# Patient Record
Sex: Male | Born: 1990 | Hispanic: Yes | Marital: Single | Smoking: Current every day smoker
Health system: Southern US, Community
[De-identification: ages and names within clinical notes are randomized; demographics above are authoritative.]

---

## 2016-04-11 ENCOUNTER — Emergency Department (HOSPITAL_COMMUNITY): Payer: Self-pay

## 2016-04-11 ENCOUNTER — Encounter (HOSPITAL_COMMUNITY): Payer: Self-pay | Admitting: Emergency Medicine

## 2016-04-11 ENCOUNTER — Emergency Department (HOSPITAL_COMMUNITY)
Admission: EM | Admit: 2016-04-11 | Discharge: 2016-04-12 | Disposition: A | Discharge status: Home / Self Care | Payer: Self-pay | Attending: Emergency Medicine | Admitting: Emergency Medicine

## 2016-04-11 DIAGNOSIS — F17200 Nicotine dependence, unspecified, uncomplicated: Secondary | ICD-10-CM | POA: Insufficient documentation

## 2016-04-11 DIAGNOSIS — K852 Alcohol induced acute pancreatitis without necrosis or infection: Secondary | ICD-10-CM | POA: Insufficient documentation

## 2016-04-11 LAB — COMPREHENSIVE METABOLIC PANEL
ALBUMIN: 4.9 g/dL (ref 3.5–5.0)
ALT: 96 U/L — AB (ref 17–63)
AST: 96 U/L — AB (ref 15–41)
Alkaline Phosphatase: 54 U/L (ref 38–126)
Anion gap: 15 (ref 5–15)
BUN: 5 mg/dL — AB (ref 6–20)
CHLORIDE: 97 mmol/L — AB (ref 101–111)
CO2: 23 mmol/L (ref 22–32)
CREATININE: 0.9 mg/dL (ref 0.61–1.24)
Calcium: 9.8 mg/dL (ref 8.9–10.3)
GFR calc Af Amer: >60 mL/min (ref >60)
GFR calc non Af Amer: >60 mL/min (ref >60)
Glucose, Bld: 135 mg/dL — ABNORMAL HIGH (ref 65–99)
POTASSIUM: 4.8 mmol/L (ref 3.5–5.1)
SODIUM: 135 mmol/L (ref 135–145)
Total Bilirubin: 1.3 mg/dL — ABNORMAL HIGH (ref 0.3–1.2)
Total Protein: 8.3 g/dL — ABNORMAL HIGH (ref 6.5–8.1)

## 2016-04-11 LAB — CBC WITH DIFFERENTIAL/PLATELET
BASOS ABS: 0 10*3/uL (ref 0.0–0.1)
BASOS PCT: 0 %
EOS ABS: 0 10*3/uL (ref 0.0–0.7)
EOS PCT: 0 %
HCT: 50.9 % (ref 39.0–52.0)
Hemoglobin: 17.8 g/dL — ABNORMAL HIGH (ref 13.0–17.0)
LYMPHS PCT: 5 %
Lymphs Abs: 0.8 10*3/uL (ref 0.7–4.0)
MCH: 31.2 pg (ref 26.0–34.0)
MCHC: 35 g/dL (ref 30.0–36.0)
MCV: 89.3 fL (ref 78.0–100.0)
Monocytes Absolute: 0.7 10*3/uL (ref 0.1–1.0)
Monocytes Relative: 5 %
Neutro Abs: 13.3 10*3/uL — ABNORMAL HIGH (ref 1.7–7.7)
Neutrophils Relative %: 90 %
PLATELETS: 245 10*3/uL (ref 150–400)
RBC: 5.7 MIL/uL (ref 4.22–5.81)
RDW: 12.6 % (ref 11.5–15.5)
WBC: 14.9 10*3/uL — AB (ref 4.0–10.5)

## 2016-04-11 LAB — URINALYSIS, ROUTINE W REFLEX MICROSCOPIC
Bilirubin Urine: NEGATIVE
GLUCOSE, UA: NEGATIVE mg/dL
Ketones, ur: NEGATIVE mg/dL
LEUKOCYTES UA: NEGATIVE
Nitrite: NEGATIVE
PROTEIN: 100 mg/dL — AB
SPECIFIC GRAVITY, URINE: 1.028 (ref 1.005–1.030)
pH: 6 (ref 5.0–8.0)

## 2016-04-11 LAB — LIPASE, BLOOD: Lipase: 897 U/L — ABNORMAL HIGH (ref 11–51)

## 2016-04-11 LAB — URINE MICROSCOPIC-ADD ON: WBC, UA: NONE SEEN WBC/hpf (ref 0–5)

## 2016-04-11 MED ORDER — HYDROMORPHONE HCL 1 MG/ML IJ SOLN
1.0000 mg | Freq: Once | INTRAMUSCULAR | Status: AC
  Administered 2016-04-11: 1 mg via INTRAVENOUS
  Filled 2016-04-11: qty 1

## 2016-04-11 MED ORDER — GI COCKTAIL ~~LOC~~
30.0000 mL | Freq: Once | ORAL | Status: AC
  Administered 2016-04-11: 30 mL via ORAL
  Filled 2016-04-11: qty 30

## 2016-04-11 MED ORDER — IOPAMIDOL (ISOVUE-300) INJECTION 61%
INTRAVENOUS | Status: AC
  Administered 2016-04-11: 100 mL
  Filled 2016-04-11: qty 100

## 2016-04-11 MED ORDER — ONDANSETRON HCL 4 MG/2ML IJ SOLN
4.0000 mg | Freq: Once | INTRAMUSCULAR | Status: AC
  Administered 2016-04-11: 4 mg via INTRAVENOUS
  Filled 2016-04-11: qty 2

## 2016-04-11 MED ORDER — SODIUM CHLORIDE 0.9 % IV BOLUS (SEPSIS)
1000.0000 mL | Freq: Once | INTRAVENOUS | Status: AC
  Administered 2016-04-11: 1000 mL via INTRAVENOUS

## 2016-04-11 MED ORDER — MORPHINE SULFATE (PF) 4 MG/ML IV SOLN
4.0000 mg | Freq: Once | INTRAVENOUS | Status: AC
  Administered 2016-04-11: 4 mg via INTRAVENOUS
  Filled 2016-04-11: qty 1

## 2016-04-11 NOTE — ED Notes (Signed)
Pt to CT

## 2016-04-11 NOTE — ED Notes (Signed)
Pt to xray

## 2016-04-11 NOTE — ED Triage Notes (Signed)
Pt c/o mid epigastric pain onset approx 3am today.  Also c/o nausea with vomiting.  Pt st's pain increases with deep breathing.  Admits  To a lot of ETOH yesterday

## 2016-04-12 MED ORDER — ONDANSETRON 4 MG PO TBDP: 4.0000 mg | ORAL_TABLET | Freq: Three times a day (TID) | ORAL | 0 refills | Status: AC | PRN

## 2016-04-12 MED ORDER — OXYCODONE HCL 5 MG PO TABS: 5.0000 mg | ORAL_TABLET | ORAL | 0 refills | Status: AC | PRN

## 2016-04-12 MED ORDER — CHLORDIAZEPOXIDE HCL 25 MG PO CAPS: ORAL_CAPSULE | ORAL | 0 refills | Status: AC

## 2016-04-12 NOTE — ED Provider Notes (Signed)
Recieved patient in turnover. 25 year old male with alcohol-induced pancreatitis. His pain and nausea were aggressively treated on my exam patient is feeling much better and requesting discharge home. Prescribed him pain medicine and nausea medicine and Librium. PCP follow-up.   Melene Planan Eleftheria Taborn, DO 04/12/16 [phone]

## 2016-04-12 NOTE — Discharge Instructions (Signed)
Clear liquid diet for the next three days or so, then slowly escalate foods.

## 2016-04-12 NOTE — ED Provider Notes (Signed)
[facility]-EMER[facility]ENCY DEPT Provider Note   CSN: [id number] Arrival date & time: 04/11/16  1700     History   Chief Complaint Chief Complaint  Patient presents with  . Abdominal Pain    HPI Jason Norris is a 25 y.o. male.  HPI 25 year old male with extensive history of drinking who presents with epigastric pain. Patient admits to increased alcohol intake over the last several days. At approximately 3 AM today, he expressed acute onset of severe, aching, gnawing, epigastric abdominal pain. He had associated multiple episodes of nonbloody, nonbilious emesis. He denies history of similar symptoms. Denies any preceding trauma. Since then, his pain is progressively worsening. He denies any alleviating factors. His pain is made worse with eating as well as palpation. No fevers. Denies history of gallstones or known pancreatitis.  History reviewed. No pertinent past medical history.  There are no active problems to display for this patient.   History reviewed. No pertinent surgical history.     Home Medications    Prior to Admission medications   Medication Sig Start Date End Date Taking? Authorizing Provider  chlordiazePOXIDE (LIBRIUM) 25 MG capsule 50mg  PO TID x 1D, then 25-50mg  PO BID X 1D, then 25-50mg  PO QD X 1D 04/12/16   Melene Planan Floyd, DO  ondansetron (ZOFRAN ODT) 4 MG disintegrating tablet Take 1 tablet (4 mg total) by mouth every 8 (eight) hours as needed for nausea or vomiting. 04/12/16   Melene Planan Floyd, DO  oxyCODONE (ROXICODONE) 5 MG immediate release tablet Take 1 tablet (5 mg total) by mouth every 4 (four) hours as needed for severe pain. 04/12/16   Melene Planan Floyd, DO    Family History No family history on file.  Social History Social History  Substance Use Topics  . Smoking status: Current Every Day Smoker  . Smokeless tobacco: Never Used  . Alcohol use Yes     Allergies   Review of patient's allergies indicates no known allergies.   Review of Systems Review of  Systems  Constitutional: Negative for chills, fatigue and fever.  HENT: Negative for congestion and rhinorrhea.   Eyes: Negative for visual disturbance.  Respiratory: Negative for cough, shortness of breath and wheezing.   Cardiovascular: Negative for chest pain and leg swelling.  Gastrointestinal: Positive for abdominal pain, nausea and vomiting. Negative for diarrhea.  Genitourinary: Negative for dysuria and flank pain.  Musculoskeletal: Negative for neck pain and neck stiffness.  Skin: Negative for rash and wound.  Allergic/Immunologic: Negative for immunocompromised state.  Neurological: Negative for syncope, weakness and headaches.  All other systems reviewed and are negative.    Physical Exam Updated Vital Signs BP 157/84   Pulse 78   Temp 97.6 F (36.4 C) (Oral)   Resp 20   Ht 6' (1.829 m)   Wt 178 lb (80.7 kg)   SpO2 100%   BMI 24.14 kg/m   Physical Exam  Constitutional: He is oriented to person, place, and time. He appears well-developed and well-nourished. No distress.  HENT:  Head: Normocephalic and atraumatic.  Eyes: Conjunctivae are normal.  Neck: Neck supple.  Cardiovascular: Normal rate, regular rhythm and normal heart sounds.  Exam reveals no friction rub.   No murmur heard. Pulmonary/Chest: Effort normal and breath sounds normal. No respiratory distress. He has no wheezes. He has no rales.  Abdominal: Soft. Normal appearance. He exhibits no distension. There is generalized tenderness and tenderness in the epigastric area. There is guarding. There is no rigidity, no rebound, no tenderness at McBurney's point  and negative Murphy's sign.  Musculoskeletal: He exhibits no edema.  Neurological: He is alert and oriented to person, place, and time. He exhibits normal muscle tone.  Skin: Skin is warm. Capillary refill takes less than 2 seconds.  Psychiatric: He has a normal mood and affect.  Nursing note and vitals reviewed.    ED Treatments / Results   Labs (all labs ordered are listed, but only abnormal results are displayed) Labs Reviewed  CBC WITH DIFFERENTIAL/PLATELET - Abnormal; Notable for the following:       Result Value   WBC 14.9 (*)    Hemoglobin 17.8 (*)    Neutro Abs 13.3 (*)    All other components within normal limits  COMPREHENSIVE METABOLIC PANEL - Abnormal; Notable for the following:    Chloride 97 (*)    Glucose, Bld 135 (*)    BUN 5 (*)    Total Protein 8.3 (*)    AST 96 (*)    ALT 96 (*)    Total Bilirubin 1.3 (*)    All other components within normal limits  LIPASE, BLOOD - Abnormal; Notable for the following:    Lipase 897 (*)    All other components within normal limits  URINALYSIS, ROUTINE W REFLEX MICROSCOPIC (NOT AT [facility]) - Abnormal; Notable for the following:    APPearance CLOUDY (*)    Hgb urine dipstick SMALL (*)    Protein, ur 100 (*)    All other components within normal limits  URINE MICROSCOPIC-ADD ON - Abnormal; Notable for the following:    Squamous Epithelial / LPF 0-5 (*)    Bacteria, UA FEW (*)    All other components within normal limits    EKG  EKG Interpretation None       Radiology Dg Chest 2 View  Result Date: 04/11/2016 CLINICAL DATA:  Chest pain, leukocytosis.  Epigastric pain. EXAM: CHEST  2 VIEW COMPARISON:  None. FINDINGS: Cardiomediastinal silhouette is normal. No pleural effusions or focal consolidations. Trachea projects midline and there is no pneumothorax. Soft tissue planes and included osseous structures are non-suspicious. IMPRESSION: Normal chest radiographs. Electronically Signed   By: Awilda Metro M.D.   On: 04/11/2016 22:55   Ct Abdomen Pelvis W Contrast  Result Date: 04/12/2016 CLINICAL DATA:  UA shin for acute epigastric pain with vomiting for 1 day. EXAM: CT ABDOMEN AND PELVIS WITH CONTRAST TECHNIQUE: Multidetector CT imaging of the abdomen and pelvis was performed using the standard protocol following bolus administration of intravenous contrast.  CONTRAST:  1 ISOVUE-300 IOPAMIDOL (ISOVUE-300) INJECTION 61% COMPARISON:  None available. FINDINGS: Lower chest: Mild hazy subsegmental atelectasis seen dependently within the visualized lung bases. Visualized lungs are otherwise clear. Hepatobiliary: Diffuse hypoattenuation of the liver consistent with steatosis. Liver otherwise unremarkable. Gallbladder normal. No biliary dilatation. Pancreas: Pancreas somewhat enlarged and edematous in appearance. There is extensive inflammatory fat stranding within the peripancreatic fat, consistent with acute pancreatitis. No evidence for pancreatic necrosis. No loculated peripancreatic collections. Soft tissue stranding with small amount of free fluid extends inferiorly down the pericolic gutters bilaterally. Inflammatory changes surround the second- third portion the duodenum as well. Spleen: Spleen within normal limits. Adrenals/Urinary Tract: Adrenal glands are normal. Kidneys equal in size with symmetric enhancement. No nephrolithiasis, hydronephrosis, or focal enhancing renal mass. Ureters of normal caliber without acute abnormality. Bladder within normal limits. Stomach/Bowel: Stomach within normal limits. Again, inflammatory changes noted about the duodenal sweep related to the inflamed pancreas. No evidence for bowel obstruction. Appendix within normal limits.  No other acute inflammatory changes about the bowels. Vascular/Lymphatic: Normal intravascular enhancement seen throughout the intra-abdominal aorta and its branch vessels. Portal vein and SMV remain patent as does the splenic vein. No adenopathy. Reproductive: Prostate is normal. Other: No free air. Scatter free fluid within the lower abdomen and pelvis, presumably related to the inflamed pancreas. Musculoskeletal: No acute osseous abnormality. No worrisome lytic or blastic osseous lesions. IMPRESSION: Findings consistent with acute pancreatitis. No complication identified. Electronically Signed   By: Rise Mu M.D.   On: 04/12/2016 00:52    Procedures Procedures (including critical care time)  Medications Ordered in ED Medications  sodium chloride 0.9 % bolus 1,000 mL (0 mLs Intravenous Stopped 04/11/16 2330)  morphine 4 MG/ML injection 4 mg (4 mg Intravenous Given 04/11/16 2229)  ondansetron (ZOFRAN) injection 4 mg (4 mg Intravenous Given 04/11/16 2229)  gi cocktail (Maalox,Lidocaine,Donnatal) (30 mLs Oral Given 04/11/16 2229)  HYDROmorphone (DILAUDID) injection 1 mg (1 mg Intravenous Given 04/11/16 2329)  iopamidol (ISOVUE-300) 61 % injection (100 mLs  Contrast Given 04/11/16 2350)     Initial Impression / Assessment and Plan / ED Course  I have reviewed the triage vital signs and the nursing notes.  Pertinent labs & imaging results that were available during my care of the patient were reviewed by me and considered in my medical decision making (see chart for details).  Clinical Course    25 year old Hispanic male who presents with epigastric abdominal pain after drinking. No chest pain. On arrival, vital signs are stable and within normal limits. Examination shows marked epigastric tenderness. Labwork reviewed and shows moderate leukocytosis, mild transaminitis, and marked elevation of lipase. This is consistent with likely acute, alcoholic pancreatitis. No right upper quadrant tenderness, Murphy's sign or evidence to suggest cholecystitis and patient has no significant elevation in alkaline phosphatase. Will obtain CT scan given first time pancreatitis, start IV fluids, and reassess. No fever or signs of septic or toxic pancreatitis. CBC shows stable hemoglobin do not suspect hemorrhagic pancreatitis. Patient is otherwise well-appearing.  CT scan is pending. Symptoms are improving. Suspect patient may be able to travel outpatient management if CT scan shows no complication.  Final Clinical Impressions(s) / ED Diagnoses   Final diagnoses:  Alcohol-induced acute pancreatitis  without infection or necrosis     Shaune Pollack, MD 04/12/16 [phone]

## 2017-09-06 ENCOUNTER — Ambulatory Visit: Payer: Self-pay | Admitting: Emergency Medicine

## 2017-09-06 ENCOUNTER — Other Ambulatory Visit: Payer: Self-pay

## 2017-09-06 ENCOUNTER — Encounter: Payer: Self-pay | Admitting: Emergency Medicine

## 2017-09-06 VITALS — BP 115/67 | HR 73 | Temp 98.8°F | Resp 16 | Ht 70.0 in | Wt 176.8 lb

## 2017-09-06 DIAGNOSIS — S81812S Laceration without foreign body, left lower leg, sequela: Secondary | ICD-10-CM

## 2017-09-06 DIAGNOSIS — S81819A Laceration without foreign body, unspecified lower leg, initial encounter: Secondary | ICD-10-CM | POA: Insufficient documentation

## 2017-09-06 DIAGNOSIS — Z4802 Encounter for removal of sutures: Secondary | ICD-10-CM

## 2017-09-06 NOTE — Progress Notes (Signed)
Joselyn ArrowFernando Bermudez [place] 27 y.o.   Chief Complaint  Patient presents with  . worker's comp    check wound on LEFT leg on 08/29/2017    HISTORY OF PRESENT ILLNESS: This is a 27 y.o. male workers comp case.  Had sutures placed on his left lower leg 08/29/2017.  Here for wound check and possible suture removal.  Doing well has no complaints.  HPI   Prior to Admission medications   Medication Sig Start Date End Date Taking? Authorizing Provider  chlordiazePOXIDE (LIBRIUM) 25 MG capsule 50mg  PO TID x 1D, then 25-50mg  PO BID X 1D, then 25-50mg  PO QD X 1D Patient not taking: Reported on 09/06/2017 04/12/16   Melene PlanFloyd, Dan, DO  ondansetron (ZOFRAN ODT) 4 MG disintegrating tablet Take 1 tablet (4 mg total) by mouth every 8 (eight) hours as needed for nausea or vomiting. Patient not taking: Reported on 09/06/2017 04/12/16   Melene PlanFloyd, Dan, DO  oxyCODONE (ROXICODONE) 5 MG immediate release tablet Take 1 tablet (5 mg total) by mouth every 4 (four) hours as needed for severe pain. Patient not taking: Reported on 09/06/2017 04/12/16   Melene PlanFloyd, Dan, DO    No Known Allergies  There are no active problems to display for this patient.   No past medical history on file.  No past surgical history on file.  Social History   Socioeconomic History  . Marital status: Single    Spouse name: Not on file  . Number of children: Not on file  . Years of education: Not on file  . Highest education level: Not on file  Social Needs  . Financial resource strain: Not on file  . Food insecurity - worry: Not on file  . Food insecurity - inability: Not on file  . Transportation needs - medical: Not on file  . Transportation needs - non-medical: Not on file  Occupational History  . Not on file  Tobacco Use  . Smoking status: Current Every Day Smoker  . Smokeless tobacco: Never Used  Substance and Sexual Activity  . Alcohol use: Yes  . Drug use: Yes    Types: Marijuana, Cocaine  . Sexual activity: Not on file    Other Topics Concern  . Not on file  Social History Narrative  . Not on file    No family history on file.   Review of Systems  Constitutional: Negative.  Negative for chills and fever.  Respiratory: Negative for shortness of breath.   Cardiovascular: Negative for chest pain.  Gastrointestinal: Negative for nausea and vomiting.  Skin: Negative for rash.  Neurological: Negative for dizziness and headaches.  All other systems reviewed and are negative.    Physical Exam  Constitutional: He appears well-developed and well-nourished.  HENT:  Head: Normocephalic and atraumatic.  Eyes: EOM are normal. Pupils are equal, round, and reactive to light.  Neck: Normal range of motion. Neck supple.  Cardiovascular: Normal rate.  Pulmonary/Chest: Effort normal.  Musculoskeletal: Normal range of motion.  Neurological: He is alert.  Skin: Capillary refill takes less than 2 seconds.  Left lower leg: Well-healing laceration.  No infection.  Some sutures are missing.  Psychiatric: He has a normal mood and affect. His behavior is normal.  Vitals reviewed.   Partial suture removal.  ASSESSMENT & PLAN: Madaline GuthrieFernando was seen today for worker's comp.  Diagnoses and all orders for this visit:  Laceration of calf, left, sequela  Visit for suture removal    Patient Instructions  IF you received an x-ray today, you will receive an invoice from [facility]. Please contact [facility] at [phone] with questions or concerns regarding your invoice.   IF you received labwork today, you will receive an invoice from [facility]. Please contact [facility] at [phone] with questions or concerns regarding your invoice.   Our billing staff will not be able to assist you with questions regarding bills from these companies.  You will be contacted with the lab results as soon as they are available. The fastest way to get your results is to activate your My Chart account.  Instructions are located on the last page of this paperwork. If you have not heard from [place] regarding the results in 2 weeks, please contact this office.     Cuidado de las [employer] adultos (Wound Care, Adult) El cuidado correcto de las heridas puede ayudar a Psychologist, sport and exercise y a Radio producer las infecciones. Adems puede ayudar a que la cicatrizacin sea ms rpida. CMO CUIDAR LA HERIDA Cuidado de las [place] las indicaciones del mdico acerca del cuidado de la herida. Haga lo siguiente: ? Lvese las manos con agua y jabn antes de cambiar la venda (vendaje). Use desinfectante para manos si no dispone de [place] y [place]. ? Cambie el vendaje como se lo haya indicado el mdico. ? No retire los puntos (suturas), el [employer] para la piel o las tiras [place]. Es posible que estos deban quedar puestos en la piel durante 2semanas o ms tiempo. Si los bordes de las tiras [address] empiezan a despegarse y Scientific laboratory technician, puede recortar los que estn sueltos. No retire las tiras [employer] por completo a menos que el mdico se lo indique.  Controle la zona de la herida todos los [address]  [phone] para detectar signos de infeccin. Est atento a los siguientes signos: ? Aumento del enrojecimiento, de la hinchazn o del dolor. ? Ms lquido [place]. ? Calor. ? Pus o mal olor.  Pregntele al mdico si debe limpiar la herida con agua y [place]. Hacer esto puede incluir lo siguiente: ? Usar una toalla limpia para secar la herida dando palmaditas despus de limpiarla. No se frote ni restregue la herida. ? Aplicar una crema o un ungento. Hgalo nicamente como se lo haya indicado el mdico. ? Cubrir la incisin con un vendaje limpio.  Pregntele al mdico cundo puede dejar la herida al descubierto. Medicamentos  Si le recetaron un antibitico, una crema o un ungento con antibitico, tmelo o aplqueselo como se lo haya indicado el mdico. No deje de tomar o de usar el antibitico aunque la afeccin  mejore.  Tome los medicamentos de venta libre y los recetados solamente como se lo haya indicado el mdico. Si le recetaron un analgsico, tmelo al menos antes de Education officer, environmental el cuidado de la herida, Public librarian se lo haya indicado el mdico. Instrucciones generales  Retome sus actividades normales como se lo haya indicado el mdico. Pregntele al mdico qu actividades son seguras.  No se rasque ni se toque la herida.  Concurra a todas las visitas de control como se lo haya indicado el mdico. Esto es importante.  Mantenga una dieta que incluya protenas, vitaminaA, vitaminaC y otros alimentos ricos en nutrientes. Estos alimentos ayudan a Personnel officer herida: ? Los alimentos ricos en protenas incluyen carne, productos lcteos, frijoles y nueces, entre otras fuentes de protena. ? Los alimentos ricos en vitaminaA incluyen zanahorias y verduras de hoja verde oscuro. ? Los alimentos ricos en vitaminaC incluyen  ctricos, tomates y otras frutas y verduras. ? Los alimentos ricos en nutrientes tienen protenas, carbohidratos, grasas, vitaminas o minerales. Consuma una variedad de alimentos saludables, que incluya frutas, verduras y Radiation protection practitioner. SOLICITE ATENCIN MDICA SI:  Le aplicaron la antitetnica y tiene hinchazn, dolor intenso, enrojecimiento o hemorragia en el lugar de la inyeccin.  El dolor no se alivia con los [employer].  Tiene ms enrojecimiento, hinchazn o dolor alrededor de la herida.  Observa lquido o sangre que proviene de la herida.  La herida est caliente al tacto.  Tiene pus o percibe mal olor que emana de la herida.  Tiene fiebre o siente escalofros.  Siente nuseas o vomita.  Tiene mareos.  SOLICITE ATENCIN MDICA DE INMEDIATO SI:  Tiene una lnea roja que sale de la herida.  Los bordes de la herida se abren y se separan.  La herida est sangrando y la hemorragia no se detiene con una presin [place].  Tiene una erupcin cutnea.  Se  desmaya.  Tiene dificultad para respirar.  Esta informacin no tiene Theme park manager el consejo del mdico. Asegrese de hacerle al mdico cualquier pregunta que tenga. Document Released: 02/10/2011 Document Revised: 10/28/2014 Document Reviewed: 12/29/2015 Elsevier Interactive Patient Education  2017 Elsevier Inc.      Edwina Barth, MD [facility]

## 2017-09-06 NOTE — Patient Instructions (Addendum)
   IF you received an x-ray today, you will receive an invoice from [facility]. Please contact [facility] at [phone] with questions or concerns regarding your invoice.   IF you received labwork today, you will receive an invoice from [facility]. Please contact [facility] at [phone] with questions or concerns regarding your invoice.   Our billing staff will not be able to assist you with questions regarding bills from these companies.  You will be contacted with the lab results as soon as they are available. The fastest way to get your results is to activate your My Chart account. Instructions are located on the last page of this paperwork. If you have not heard from us regarding the results in 2 weeks, please contact this office.     Cuidado de las heridas en los adultos (Wound Care, Adult) El cuidado correcto de las heridas puede ayudar a evitar el dolor y a prevenir las infecciones. Adems puede ayudar a que la cicatrizacin sea ms rpida. CMO CUIDAR LA HERIDA Cuidado de las heridas  Siga las indicaciones del mdico acerca del cuidado de la herida. Haga lo siguiente: ? Lvese las manos con agua y jabn antes de cambiar la venda (vendaje). Use desinfectante para manos si no dispone de agua y jabn. ? Cambie el vendaje como se lo haya indicado el mdico. ? No retire los puntos (suturas), el adhesivo para la piel o las tiras adhesivas. Es posible que estos deban quedar puestos en la piel durante 2semanas o ms tiempo. Si los bordes de las tiras adhesivas empiezan a despegarse y enroscarse, puede recortar los que estn sueltos. No retire las tiras adhesivas por completo a menos que el mdico se lo indique.  Controle la zona de la herida todos los das para detectar signos de infeccin. Est atento a los siguientes signos: ? Aumento del enrojecimiento, de la hinchazn o del dolor. ? Ms lquido o sangre. ? Calor. ? Pus o mal olor.  Pregntele al mdico si  debe limpiar la herida con agua y jabn suave. Hacer esto puede incluir lo siguiente: ? Usar una toalla limpia para secar la herida dando palmaditas despus de limpiarla. No se frote ni restregue la herida. ? Aplicar una crema o un ungento. Hgalo nicamente como se lo haya indicado el mdico. ? Cubrir la incisin con un vendaje limpio.  Pregntele al mdico cundo puede dejar la herida al descubierto. Medicamentos  Si le recetaron un antibitico, una crema o un ungento con antibitico, tmelo o aplqueselo como se lo haya indicado el mdico. No deje de tomar o de usar el antibitico aunque la afeccin mejore.  Tome los medicamentos de venta libre y los recetados solamente como se lo haya indicado el mdico. Si le recetaron un analgsico, tmelo al menos 30minutos antes de realizar el cuidado de la herida, como se lo haya indicado el mdico. Instrucciones generales  Retome sus actividades normales como se lo haya indicado el mdico. Pregntele al mdico qu actividades son seguras.  No se rasque ni se toque la herida.  Concurra a todas las visitas de control como se lo haya indicado el mdico. Esto es importante.  Mantenga una dieta que incluya protenas, vitaminaA, vitaminaC y otros alimentos ricos en nutrientes. Estos alimentos ayudan a cicatrizar la herida: ? Los alimentos ricos en protenas incluyen carne, productos lcteos, frijoles y nueces, entre otras fuentes de protena. ? Los alimentos ricos en vitaminaA incluyen zanahorias y verduras de hoja verde oscuro. ? Los alimentos ricos en   vitaminaC incluyen ctricos, tomates y otras frutas y verduras. ? Los alimentos ricos en nutrientes tienen protenas, carbohidratos, grasas, vitaminas o minerales. Consuma una variedad de alimentos saludables, que incluya frutas, verduras y cereales integrales. SOLICITE ATENCIN MDICA SI:  Le aplicaron la antitetnica y tiene hinchazn, dolor intenso, enrojecimiento o hemorragia en el lugar de la  inyeccin.  El dolor no se alivia con los medicamentos.  Tiene ms enrojecimiento, hinchazn o dolor alrededor de la herida.  Observa lquido o sangre que proviene de la herida.  La herida est caliente al tacto.  Tiene pus o percibe mal olor que emana de la herida.  Tiene fiebre o siente escalofros.  Siente nuseas o vomita.  Tiene mareos.  SOLICITE ATENCIN MDICA DE INMEDIATO SI:  Tiene una lnea roja que sale de la herida.  Los bordes de la herida se abren y se separan.  La herida est sangrando y la hemorragia no se detiene con una presin suave.  Tiene una erupcin cutnea.  Se desmaya.  Tiene dificultad para respirar.  Esta informacin no tiene como fin reemplazar el consejo del mdico. Asegrese de hacerle al mdico cualquier pregunta que tenga. Document Released: 02/10/2011 Document Revised: 10/28/2014 Document Reviewed: 12/29/2015 Elsevier Interactive Patient Education  2017 Elsevier Inc.  

## 2017-09-12 ENCOUNTER — Other Ambulatory Visit: Payer: Self-pay

## 2017-09-12 ENCOUNTER — Ambulatory Visit: Payer: Self-pay | Admitting: Emergency Medicine

## 2017-09-12 ENCOUNTER — Encounter: Payer: Self-pay | Admitting: Emergency Medicine

## 2017-09-12 VITALS — BP 129/64 | HR 73 | Temp 98.3°F | Resp 16 | Wt 179.6 lb

## 2017-09-12 DIAGNOSIS — Z4802 Encounter for removal of sutures: Secondary | ICD-10-CM

## 2017-09-12 DIAGNOSIS — T148XXA Other injury of unspecified body region, initial encounter: Secondary | ICD-10-CM

## 2017-09-12 DIAGNOSIS — L089 Local infection of the skin and subcutaneous tissue, unspecified: Secondary | ICD-10-CM

## 2017-09-12 DIAGNOSIS — S81812S Laceration without foreign body, left lower leg, sequela: Secondary | ICD-10-CM

## 2017-09-12 MED ORDER — CEPHALEXIN 500 MG PO CAPS: 500.0000 mg | ORAL_CAPSULE | Freq: Three times a day (TID) | ORAL | 0 refills | Status: AC

## 2017-09-12 NOTE — Patient Instructions (Addendum)
   IF you received an x-ray today, you will receive an invoice from [facility]. Please contact [facility] at [phone] with questions or concerns regarding your invoice.   IF you received labwork today, you will receive an invoice from [facility]. Please contact [facility] at [phone] with questions or concerns regarding your invoice.   Our billing staff will not be able to assist you with questions regarding bills from these companies.  You will be contacted with the lab results as soon as they are available. The fastest way to get your results is to activate your My Chart account. Instructions are located on the last page of this paperwork. If you have not heard from us regarding the results in 2 weeks, please contact this office.     Cuidado de las heridas en los adultos (Wound Care, Adult) El cuidado correcto de las heridas puede ayudar a evitar el dolor y a prevenir las infecciones. Adems puede ayudar a que la cicatrizacin sea ms rpida. CMO CUIDAR LA HERIDA Cuidado de las heridas  Siga las indicaciones del mdico acerca del cuidado de la herida. Haga lo siguiente: ? Lvese las manos con agua y jabn antes de cambiar la venda (vendaje). Use desinfectante para manos si no dispone de agua y jabn. ? Cambie el vendaje como se lo haya indicado el mdico. ? No retire los puntos (suturas), el adhesivo para la piel o las tiras adhesivas. Es posible que estos deban quedar puestos en la piel durante 2semanas o ms tiempo. Si los bordes de las tiras adhesivas empiezan a despegarse y enroscarse, puede recortar los que estn sueltos. No retire las tiras adhesivas por completo a menos que el mdico se lo indique.  Controle la zona de la herida todos los das para detectar signos de infeccin. Est atento a los siguientes signos: ? Aumento del enrojecimiento, de la hinchazn o del dolor. ? Ms lquido o sangre. ? Calor. ? Pus o mal olor.  Pregntele al mdico si  debe limpiar la herida con agua y jabn suave. Hacer esto puede incluir lo siguiente: ? Usar una toalla limpia para secar la herida dando palmaditas despus de limpiarla. No se frote ni restregue la herida. ? Aplicar una crema o un ungento. Hgalo nicamente como se lo haya indicado el mdico. ? Cubrir la incisin con un vendaje limpio.  Pregntele al mdico cundo puede dejar la herida al descubierto. Medicamentos  Si le recetaron un antibitico, una crema o un ungento con antibitico, tmelo o aplqueselo como se lo haya indicado el mdico. No deje de tomar o de usar el antibitico aunque la afeccin mejore.  Tome los medicamentos de venta libre y los recetados solamente como se lo haya indicado el mdico. Si le recetaron un analgsico, tmelo al menos 30minutos antes de realizar el cuidado de la herida, como se lo haya indicado el mdico. Instrucciones generales  Retome sus actividades normales como se lo haya indicado el mdico. Pregntele al mdico qu actividades son seguras.  No se rasque ni se toque la herida.  Concurra a todas las visitas de control como se lo haya indicado el mdico. Esto es importante.  Mantenga una dieta que incluya protenas, vitaminaA, vitaminaC y otros alimentos ricos en nutrientes. Estos alimentos ayudan a cicatrizar la herida: ? Los alimentos ricos en protenas incluyen carne, productos lcteos, frijoles y nueces, entre otras fuentes de protena. ? Los alimentos ricos en vitaminaA incluyen zanahorias y verduras de hoja verde oscuro. ? Los alimentos ricos en   vitaminaC incluyen ctricos, tomates y otras frutas y verduras. ? Los alimentos ricos en nutrientes tienen protenas, carbohidratos, grasas, vitaminas o minerales. Consuma una variedad de alimentos saludables, que incluya frutas, verduras y cereales integrales. SOLICITE ATENCIN MDICA SI:  Le aplicaron la antitetnica y tiene hinchazn, dolor intenso, enrojecimiento o hemorragia en el lugar de la  inyeccin.  El dolor no se alivia con los medicamentos.  Tiene ms enrojecimiento, hinchazn o dolor alrededor de la herida.  Observa lquido o sangre que proviene de la herida.  La herida est caliente al tacto.  Tiene pus o percibe mal olor que emana de la herida.  Tiene fiebre o siente escalofros.  Siente nuseas o vomita.  Tiene mareos.  SOLICITE ATENCIN MDICA DE INMEDIATO SI:  Tiene una lnea roja que sale de la herida.  Los bordes de la herida se abren y se separan.  La herida est sangrando y la hemorragia no se detiene con una presin suave.  Tiene una erupcin cutnea.  Se desmaya.  Tiene dificultad para respirar.  Esta informacin no tiene como fin reemplazar el consejo del mdico. Asegrese de hacerle al mdico cualquier pregunta que tenga. Document Released: 02/10/2011 Document Revised: 10/28/2014 Document Reviewed: 12/29/2015 Elsevier Interactive Patient Education  2017 Elsevier Inc.  

## 2017-09-12 NOTE — Progress Notes (Signed)
Jason Norris 26 y.o.   Chief Complaint  Patient presents with  . suture removal    left calf    HISTORY OF PRESENT ILLNESS: This is a 27 y.o. male here for follow-up of left lower leg laceration.  Doing well.  Has no complaints.  Some sutures still in place.  Here for removal.  States the area around the wound hurts a little bit.  HPI   Prior to Admission medications   Medication Sig Start Date End Date Taking? Authorizing Provider  oxyCODONE (ROXICODONE) 5 MG immediate release tablet Take 1 tablet (5 mg total) by mouth every 4 (four) hours as needed for severe pain. 04/12/16  Yes Melene Plan, DO  chlordiazePOXIDE (LIBRIUM) 25 MG capsule 50mg  PO TID x 1D, then 25-50mg  PO BID X 1D, then 25-50mg  PO QD X 1D Patient not taking: Reported on 09/06/2017 04/12/16   Melene Plan, DO  ondansetron (ZOFRAN ODT) 4 MG disintegrating tablet Take 1 tablet (4 mg total) by mouth every 8 (eight) hours as needed for nausea or vomiting. Patient not taking: Reported on 09/06/2017 04/12/16   Melene Plan, DO    No Known Allergies  Patient Active Problem List   Diagnosis Date Noted  . Laceration of calf 09/06/2017  . Visit for suture removal 09/06/2017    No past medical history on file.  No past surgical history on file.  Social History   Socioeconomic History  . Marital status: Single    Spouse name: Not on file  . Number of children: Not on file  . Years of education: Not on file  . Highest education level: Not on file  Social Needs  . Financial resource strain: Not on file  . Food insecurity - worry: Not on file  . Food insecurity - inability: Not on file  . Transportation needs - medical: Not on file  . Transportation needs - non-medical: Not on file  Occupational History  . Not on file  Tobacco Use  . Smoking status: Current Every Day Smoker  . Smokeless tobacco: Never Used  Substance and Sexual Activity  . Alcohol use: Yes  . Drug use: Yes    Types: Marijuana, Cocaine  .  Sexual activity: Not on file  Other Topics Concern  . Not on file  Social History Narrative  . Not on file    No family history on file.   Review of Systems  Constitutional: Negative.  Negative for chills and fever.  Respiratory: Negative.  Negative for shortness of breath.   Cardiovascular: Negative.  Negative for chest pain and palpitations.  Gastrointestinal: Negative.  Negative for abdominal pain, diarrhea, nausea and vomiting.  Genitourinary: Negative.  Negative for hematuria.  Skin: Positive for rash.  Neurological: Negative.   Endo/Heme/Allergies: Negative.   All other systems reviewed and are negative.  Vitals:   09/12/17 1040  BP: 129/64  Pulse: 73  Resp: 16  Temp: 98.3 F (36.8 C)  SpO2: 97%     Physical Exam  Constitutional: He is oriented to person, place, and time. He appears well-developed and well-nourished.  HENT:  Head: Normocephalic and atraumatic.  Eyes: EOM are normal. Pupils are equal, round, and reactive to light.  Neck: Normal range of motion. Neck supple.  Cardiovascular: Normal rate and regular rhythm.  Pulmonary/Chest: Effort normal and breath sounds normal.  Musculoskeletal: Normal range of motion.  Left lower leg: Sutures in place.  Removed.  Erythematous and slightly tender skin on one end.  Suspicious for infection.  Neurological: He is alert and oriented to person, place, and time.  Skin: Skin is warm and dry. Capillary refill takes less than 2 seconds.  Vitals reviewed.  Sutures removed without complication.  Patient tolerated procedure well.  No bleeding.  No discharge noted.  ASSESSMENT & PLAN: Jason Norris was seen today for suture removal.  Diagnoses and all orders for this visit:  Laceration of calf, left, sequela  Encounter for removal of sutures  Wound infection -     cephALEXin (KEFLEX) 500 MG capsule; Take 1 capsule (500 mg total) by mouth 3 (three) times daily for 3 days.    Patient Instructions       IF you  received an x-ray today, [facility]. [facility] at [phone] with questions or concerns regarding your invoice.   IF you received labwork today, you will receive an [facility] from [facility]. Please contact [facility] at [phone] with questions or concerns regarding your invoice.   Our billing staff will not be able to assist you with questions regarding bills from these companies.  You will be contacted with the lab results as soon as they are available. The fastest way to get your results is to activate your My Chart account. Instructions are located on the last page of this paperwork. If you have not heard from us regarding the results in 2 weeks, please contact this office.     Cuidado de las BlueLinxheridas en los adultos (Wound Care, Adult) El cuidado correcto de las heridas puede ayudar a Psychologist, sport and exerciseevitar el dolor y a Radio producerprevenir las infecciones. Adems puede ayudar a que la cicatrizacin sea ms rpida. CMO CUIDAR LA HERIDA Cuidado de las [place]idas  Siga las indicaciones del mdico acerca del cuidado de la herida. Haga lo siguiente: ? Lvese las manos con agua y jabn antes de cambiar la venda (vendaje). Use desinfectante para manos si no dispone de [place] y [place]. ? Cambie el vendaje como se lo haya indicado el mdico. ? No retire los puntos (suturas), el QUALCOMMadhesivo para la piel o las tiras [place]adhesivas. Es posible que estos deban quedar puestos en la piel durante 2semanas o ms tiempo. Si los bordes de las tiras [address]hesivas empiezan a despegarse y Scientific laboratory technicianenroscarse, puede recortar los que estn sueltos. No retire las tiras Agilent Technologiesadhesivas por completo a menos que el mdico se lo indique.  Controle la zona de la herida todos los [address]  [id number] para detectar signos de infeccin. Est atento a los siguientes signos: ? Aumento del enrojecimiento, de la hinchazn o del dolor. ? Ms lquido [place] sangre. ? Calor. ? Pus o mal olor.  Pregntele al mdico si debe limpiar la  herida con agua y [place]jabn suave. Hacer esto puede incluir lo siguiente: ? Usar una toalla limpia para secar la herida dando palmaditas despus de limpiarla. No se frote ni restregue la herida. ? Aplicar una crema o un ungento. Hgalo nicamente como se lo haya indicado el mdico. ? Cubrir la incisin con un vendaje limpio.  Pregntele al mdico cundo puede dejar la herida al descubierto. Medicamentos  Si le recetaron un antibitico, una crema o un ungento con antibitico, tmelo o aplqueselo como se lo haya indicado el mdico. No deje de tomar o de usar el antibitico aunque la afeccin mejore.  Tome los medicamentos de venta libre y los recetados solamente como se lo haya indicado el mdico. Si le recetaron un analgsico, tmelo al menos 30minutos antes de Education officer, environmentalrealizar el cuidado de la herida, [place]  se lo haya indicado el mdico. Instrucciones generales  Retome sus actividades normales como se lo haya indicado el mdico. Pregntele al mdico qu actividades son seguras.  No se rasque ni se toque la herida.  Concurra a todas las visitas de control como se lo haya indicado el mdico. Esto es importante.  Mantenga una dieta que incluya protenas, vitaminaA, vitaminaC y otros alimentos ricos en nutrientes. Estos alimentos ayudan a Personnel officer herida: ? Los alimentos ricos en protenas incluyen carne, productos lcteos, frijoles y nueces, entre otras fuentes de protena. ? Los alimentos ricos en vitaminaA incluyen zanahorias y verduras de hoja verde oscuro. ? Los alimentos ricos en vitaminaC incluyen ctricos, tomates y [place] frutas y verduras. ? Los alimentos ricos en nutrientes tienen protenas, carbohidratos, grasas, vitaminas o minerales. Consuma una variedad de alimentos saludables, que incluya frutas, verduras y Radiation protection practitioner. SOLICITE ATENCIN MDICA SI:  Le aplicaron la antitetnica y tiene hinchazn, dolor intenso, enrojecimiento o hemorragia en el lugar de la  inyeccin.  El dolor no se alivia con los [employer].  Tiene ms enrojecimiento, hinchazn o dolor alrededor de la herida.  Observa lquido o sangre que proviene de la herida.  La herida est caliente al tacto.  Tiene pus o percibe mal olor que emana de la herida.  Tiene fiebre o siente escalofros.  Siente nuseas o vomita.  Tiene mareos.  SOLICITE ATENCIN MDICA DE INMEDIATO SI:  Tiene una lnea roja que sale de la herida.  Los bordes de la herida se abren y se separan.  La herida est sangrando y la hemorragia no se detiene con una presin [place].  Tiene una erupcin cutnea.  Se desmaya.  Tiene dificultad para respirar.  Esta informacin no tiene Theme park manager el consejo del mdico. Asegrese de hacerle al mdico cualquier pregunta que tenga. Document Released: 02/10/2011 Document Revised: 10/28/2014 Document Reviewed: 12/29/2015 Elsevier Interactive Patient Education  2017 Elsevier Inc.      Edwina Barth, MD [facility]

## 2017-09-22 ENCOUNTER — Ambulatory Visit (INDEPENDENT_AMBULATORY_CARE_PROVIDER_SITE_OTHER): Payer: Self-pay | Admitting: Emergency Medicine

## 2017-09-22 ENCOUNTER — Other Ambulatory Visit: Payer: Self-pay

## 2017-09-22 ENCOUNTER — Ambulatory Visit: Payer: Self-pay | Admitting: Emergency Medicine

## 2017-09-22 ENCOUNTER — Encounter: Payer: Self-pay | Admitting: Emergency Medicine

## 2017-09-22 VITALS — BP 144/76 | HR 80 | Temp 98.3°F | Resp 16 | Wt 173.8 lb

## 2017-09-22 DIAGNOSIS — S81812S Laceration without foreign body, left lower leg, sequela: Secondary | ICD-10-CM

## 2017-09-22 NOTE — Patient Instructions (Addendum)
   IF you received an x-ray today, you will receive an invoice from [facility]. Please contact [facility] at [phone] with questions or concerns regarding your invoice.   IF you received labwork today, you will receive an invoice from [facility]. Please contact [facility] at [phone] with questions or concerns regarding your invoice.   Our billing staff will not be able to assist you with questions regarding bills from these companies.  You will be contacted with the lab results as soon as they are available. The fastest way to get your results is to activate your My Chart account. Instructions are located on the last page of this paperwork. If you have not heard from us regarding the results in 2 weeks, please contact this office.     Cuidado de las heridas en los adultos (Wound Care, Adult) El cuidado correcto de las heridas puede ayudar a evitar el dolor y a prevenir las infecciones. Adems puede ayudar a que la cicatrizacin sea ms rpida. CMO CUIDAR LA HERIDA Cuidado de las heridas  Siga las indicaciones del mdico acerca del cuidado de la herida. Haga lo siguiente: ? Lvese las manos con agua y jabn antes de cambiar la venda (vendaje). Use desinfectante para manos si no dispone de agua y jabn. ? Cambie el vendaje como se lo haya indicado el mdico. ? No retire los puntos (suturas), el adhesivo para la piel o las tiras adhesivas. Es posible que estos deban quedar puestos en la piel durante 2semanas o ms tiempo. Si los bordes de las tiras adhesivas empiezan a despegarse y enroscarse, puede recortar los que estn sueltos. No retire las tiras adhesivas por completo a menos que el mdico se lo indique.  Controle la zona de la herida todos los das para detectar signos de infeccin. Est atento a los siguientes signos: ? Aumento del enrojecimiento, de la hinchazn o del dolor. ? Ms lquido o sangre. ? Calor. ? Pus o mal olor.  Pregntele al mdico si  debe limpiar la herida con agua y jabn suave. Hacer esto puede incluir lo siguiente: ? Usar una toalla limpia para secar la herida dando palmaditas despus de limpiarla. No se frote ni restregue la herida. ? Aplicar una crema o un ungento. Hgalo nicamente como se lo haya indicado el mdico. ? Cubrir la incisin con un vendaje limpio.  Pregntele al mdico cundo puede dejar la herida al descubierto. Medicamentos  Si le recetaron un antibitico, una crema o un ungento con antibitico, tmelo o aplqueselo como se lo haya indicado el mdico. No deje de tomar o de usar el antibitico aunque la afeccin mejore.  Tome los medicamentos de venta libre y los recetados solamente como se lo haya indicado el mdico. Si le recetaron un analgsico, tmelo al menos 30minutos antes de realizar el cuidado de la herida, como se lo haya indicado el mdico. Instrucciones generales  Retome sus actividades normales como se lo haya indicado el mdico. Pregntele al mdico qu actividades son seguras.  No se rasque ni se toque la herida.  Concurra a todas las visitas de control como se lo haya indicado el mdico. Esto es importante.  Mantenga una dieta que incluya protenas, vitaminaA, vitaminaC y otros alimentos ricos en nutrientes. Estos alimentos ayudan a cicatrizar la herida: ? Los alimentos ricos en protenas incluyen carne, productos lcteos, frijoles y nueces, entre otras fuentes de protena. ? Los alimentos ricos en vitaminaA incluyen zanahorias y verduras de hoja verde oscuro. ? Los alimentos ricos en   vitaminaC incluyen ctricos, tomates y otras frutas y verduras. ? Los alimentos ricos en nutrientes tienen protenas, carbohidratos, grasas, vitaminas o minerales. Consuma una variedad de alimentos saludables, que incluya frutas, verduras y Radiation protection practitionercereales integrales. SOLICITE ATENCIN MDICA SI:  Le aplicaron la antitetnica y tiene hinchazn, dolor intenso, enrojecimiento o hemorragia en el lugar de la  inyeccin.  El dolor no se alivia con los United Parcelmedicamentos.  Tiene ms enrojecimiento, hinchazn o dolor alrededor de la herida.  Observa lquido o sangre que proviene de la herida.  La herida est caliente al tacto.  Tiene pus o percibe mal olor que emana de la herida.  Tiene fiebre o siente escalofros.  Siente nuseas o vomita.  Tiene mareos.  SOLICITE ATENCIN MDICA DE INMEDIATO SI:  Tiene una lnea roja que sale de la herida.  Los bordes de la herida se abren y se separan.  La herida est sangrando y la hemorragia no se detiene con una presin [place].  Tiene una erupcin cutnea.  Se desmaya.  Tiene dificultad para respirar.  Esta informacin no tiene Theme park managercomo fin reemplazar el consejo del mdico. Asegrese de hacerle al mdico cualquier pregunta que tenga. Document Released: 02/10/2011 Document Revised: 10/28/2014 Document Reviewed: 12/29/2015 Elsevier Interactive Patient Education  2017 Elsevier Inc.  Wound Care, Adult Taking care of your wound properly can help to prevent pain and infection. It can also help your wound to heal more quickly. How is this treated? Wound care  Follow instructions from your health care provider about how to take care of your wound. Make sure you: ? Wash your hands with soap and water before you change the bandage (dressing). If soap and water are not available, use hand sanitizer. ? Change your dressing as told by your health care provider. ? Leave stitches (sutures), skin glue, or adhesive strips in place. These skin closures may need to stay in place for 2 weeks or longer. If adhesive strip edges start to loosen and curl up, you may trim the loose edges. Do not remove adhesive strips completely unless your health care provider tells you to do that.  Check your wound area every day for signs of infection. Check for: ? More redness, swelling, or pain. ? More fluid or blood. ? Warmth. ? Pus or a bad smell.  Ask your health care provider  if you should clean the wound with mild soap and water. Doing this may include: ? Using a clean towel to pat the wound dry after cleaning it. Do not rub or scrub the wound. ? Applying a cream or ointment. Do this only as told by your health care provider. ? Covering the incision with a clean dressing.  Ask your health care provider when you can leave the wound uncovered. Medicines   If you were prescribed an antibiotic medicine, cream, or ointment, take or use the antibiotic as told by your health care provider. Do not stop taking or using the antibiotic even if your condition improves.  Take over-the-counter and prescription medicines only as told by your health care provider. If you were prescribed pain medicine, take it at least 30 minutes before doing any wound care or as told by your health care provider. General instructions  Return to your normal activities as told by your health care provider. Ask your health care provider what activities are safe.  Do not scratch or pick at the wound.  Keep all follow-up visits as told by your health care provider. This is important.  Eat a diet  that includes protein, vitamin A, vitamin C, and other nutrient-rich foods. These help the wound heal: ? Protein-rich foods include meat, dairy, beans, nuts, and other sources. ? Vitamin A-rich foods include carrots and dark green, leafy vegetables. ? Vitamin C-rich foods include citrus, tomatoes, and other fruits and vegetables. ? Nutrient-rich foods have protein, carbohydrates, fat, vitamins, or minerals. Eat a variety of healthy foods including vegetables, fruits, and whole grains. Contact a health care provider if:  You received a tetanus shot and you have swelling, severe pain, redness, or bleeding at the injection site.  Your pain is not controlled with medicine.  You have more redness, swelling, or pain around the wound.  You have more fluid or blood coming from the wound.  Your wound feels  warm to the touch.  You have pus or a bad smell coming from the wound.  You have a fever or chills.  You are nauseous or you vomit.  You are dizzy. Get help right away if:  You have a red streak going away from your wound.  The edges of the wound open up and separate.  Your wound is bleeding and the bleeding does not stop with gentle pressure.  You have a rash.  You faint.  You have trouble breathing. This information is not intended to replace advice given to you by your health care provider. Make sure you discuss any questions you have with your health care provider. Document Released: 03/22/2008 Document Revised: 02/10/2016 Document Reviewed: 12/29/2015 Elsevier Interactive Patient Education  2017 [employer].

## 2017-09-22 NOTE — Progress Notes (Signed)
Jason Norris 27 y.o.   Chief Complaint  Patient presents with  . Leg Pain    WORKER'S COMP  injury-left calf - itching and pain a little    HISTORY OF PRESENT ILLNESS: This is a 27 y.o. male here for left calf laceration follow-up.  Since then about 2 weeks ago.  Still has some itching and a little bit of pain.  Otherwise doing well.  HPI   Prior to Admission medications   Medication Sig Start Date End Date Taking? Authorizing Provider  chlordiazePOXIDE (LIBRIUM) 25 MG capsule 50mg  PO TID x 1D, then 25-50mg  PO BID X 1D, then 25-50mg  PO QD X 1D Patient not taking: Reported on 09/06/2017 04/12/16   Melene Plan, DO  ondansetron (ZOFRAN ODT) 4 MG disintegrating tablet Take 1 tablet (4 mg total) by mouth every 8 (eight) hours as needed for nausea or vomiting. Patient not taking: Reported on 09/06/2017 04/12/16   Melene Plan, DO  oxyCODONE (ROXICODONE) 5 MG immediate release tablet Take 1 tablet (5 mg total) by mouth every 4 (four) hours as needed for severe pain. Patient not taking: Reported on 09/22/2017 04/12/16   Melene Plan, DO    No Known Allergies  Patient Active Problem List   Diagnosis Date Noted  . Laceration of calf 09/06/2017  . Visit for suture removal 09/06/2017    No past medical history on file.  No past surgical history on file.  Social History   Socioeconomic History  . Marital status: Single    Spouse name: Not on file  . Number of children: Not on file  . Years of education: Not on file  . Highest education level: Not on file  Occupational History  . Not on file  Social Needs  . Financial resource strain: Not on file  . Food insecurity:    Worry: Not on file    Inability: Not on file  . Transportation needs:    Medical: Not on file    Non-medical: Not on file  Tobacco Use  . Smoking status: Current Every Day Smoker  . Smokeless tobacco: Never Used  Substance and Sexual Activity  . Alcohol use: Yes  . Drug use: Yes    Types: Marijuana,  Cocaine  . Sexual activity: Not on file  Lifestyle  . Physical activity:    Days per week: Not on file    Minutes per session: Not on file  . Stress: Not on file  Relationships  . Social connections:    Talks on phone: Not on file    Gets together: Not on file    Attends religious service: Not on file    Active member of club or organization: Not on file    Attends meetings of clubs or organizations: Not on file    Relationship status: Not on file  . Intimate partner violence:    Fear of current or ex partner: Not on file    Emotionally abused: Not on file    Physically abused: Not on file    Forced sexual activity: Not on file  Other Topics Concern  . Not on file  Social History Narrative  . Not on file    No family history on file.   Review of Systems  Constitutional: Negative.  Negative for chills and fever.  Respiratory: Negative for shortness of breath.   Cardiovascular: Negative for chest pain.  Gastrointestinal: Negative for abdominal pain, nausea and vomiting.  Genitourinary: Negative for dysuria and hematuria.  Musculoskeletal: Negative  for back pain, myalgias and neck pain.  Skin: Positive for itching.  Neurological: Negative.  Negative for dizziness and headaches.  Endo/Heme/Allergies: Negative.   All other systems reviewed and are negative.   Vitals:   09/22/17 1424  BP: (!) 144/76  Pulse: 80  Resp: 16  Temp: 98.3 F (36.8 C)  SpO2: 98%    Physical Exam  Constitutional: He is oriented to person, place, and time. He appears well-developed and well-nourished.  HENT:  Head: Normocephalic and atraumatic.  Eyes: Pupils are equal, round, and reactive to light. EOM are normal.  Neck: Normal range of motion. Neck supple.  Cardiovascular: Normal rate and regular rhythm.  Pulmonary/Chest: Effort normal.  Musculoskeletal: Normal range of motion.  Neurological: He is alert and oriented to person, place, and time. No sensory deficit. He exhibits normal  muscle tone.  Skin:  Left calf: Laceration healing well.  No erythema.  Slightly tender underlying hematoma.  No sign of infection.  Psychiatric: He has a normal mood and affect. His behavior is normal.  Vitals reviewed.       ASSESSMENT & PLAN: [name] was seen today for leg pain.  Diagnoses and all orders for this visit:  Laceration of calf, left, sequela    Patient Instructions       IF you received an x-ray today, you will receive an invoice from [facility]. Please contact [facility] at [phone] with questions or concerns regarding your invoice.   IF you received labwork today, you will receive an invoice from [facility]. Please contact [facility] at [phone] with questions or concerns regarding your invoice.   Our billing staff will not be able to assist you with questions regarding bills from these companies.  You will be contacted with the lab results as soon as they are available. The fastest way to get your results is to activate your My Chart account. Instructions are located on the last page of this paperwork. If you have not heard from [place] regarding the results in 2 weeks, please contact this office.     Cuidado de las [employer] adultos (Wound Care, Adult) El cuidado correcto de las heridas puede ayudar a Psychologist, sport and exercise y a Radio producer las infecciones. Adems puede ayudar a que la cicatrizacin sea ms rpida. CMO CUIDAR LA HERIDA Cuidado de las [place] las indicaciones del mdico acerca del cuidado de la herida. Haga lo siguiente: ? Lvese las manos con agua y jabn antes de cambiar la venda (vendaje). Use desinfectante para manos si no dispone de [place] y [place]. ? Cambie el vendaje como se lo haya indicado el mdico. ? No retire los puntos (suturas), el [employer] para la piel o las tiras [place]. Es posible que estos deban quedar puestos en la piel durante 2semanas o ms tiempo. Si los bordes de las tiras [address] empiezan a  despegarse y Scientific laboratory technician, puede recortar los que estn sueltos. No retire las tiras [employer] por completo a menos que el mdico se lo indique.  Controle la zona de la herida todos los [address]  [address] para detectar signos de infeccin. Est atento a los siguientes signos: ? Aumento del enrojecimiento, de la hinchazn o del dolor. ? Ms lquido [place]. ? Calor. ? Pus o mal olor.  Pregntele al mdico si debe limpiar la herida con agua y [place]. Hacer esto puede incluir lo siguiente: ? Usar una toalla limpia para secar la herida dando palmaditas despus de limpiarla. No se frote ni restregue la herida. ?  Aplicar una crema o un ungento. Hgalo nicamente como se lo haya indicado el mdico. ? Cubrir la incisin con un vendaje limpio.  Pregntele al mdico cundo puede dejar la herida al descubierto. Medicamentos  Si le recetaron un antibitico, una crema o un ungento con antibitico, tmelo o aplqueselo como se lo haya indicado el mdico. No deje de tomar o de usar el antibitico aunque la afeccin mejore.  Tome los medicamentos de venta libre y los recetados solamente como se lo haya indicado el mdico. Si le recetaron un analgsico, tmelo al menos antes de Education officer, environmental el cuidado de la herida, Public librarian se lo haya indicado el mdico. Instrucciones generales  Retome sus actividades normales como se lo haya indicado el mdico. Pregntele al mdico qu actividades son seguras.  No se rasque ni se toque la herida.  Concurra a todas las visitas de control como se lo haya indicado el mdico. Esto es importante.  Mantenga una dieta que incluya protenas, vitaminaA, vitaminaC y otros alimentos ricos en nutrientes. Estos alimentos ayudan a Personnel officer herida: ? Los alimentos ricos en protenas incluyen carne, productos lcteos, frijoles y nueces, entre otras fuentes de protena. ? Los alimentos ricos en vitaminaA incluyen zanahorias y verduras de hoja verde oscuro. ? Los alimentos ricos en  vitaminaC incluyen ctricos, tomates y [place] frutas y verduras. ? Los alimentos ricos en nutrientes tienen protenas, carbohidratos, grasas, vitaminas o minerales. Consuma una variedad de alimentos saludables, que incluya frutas, verduras y Radiation protection practitioner. SOLICITE ATENCIN MDICA SI:  Le aplicaron la antitetnica y tiene hinchazn, dolor intenso, enrojecimiento o hemorragia en el lugar de la inyeccin.  El dolor no se alivia con los [employer].  Tiene ms enrojecimiento, hinchazn o dolor alrededor de la herida.  Observa lquido o sangre que proviene de la herida.  La herida est caliente al tacto.  Tiene pus o percibe mal olor que emana de la herida.  Tiene fiebre o siente escalofros.  Siente nuseas o vomita.  Tiene mareos.  SOLICITE ATENCIN MDICA DE INMEDIATO SI:  Tiene una lnea roja que sale de la herida.  Los bordes de la herida se abren y se separan.  La herida est sangrando y la hemorragia no se detiene con una presin [place].  Tiene una erupcin cutnea.  Se desmaya.  Tiene dificultad para respirar.  Esta informacin no tiene Theme park manager el consejo del mdico. Asegrese de hacerle al mdico cualquier pregunta que tenga. Document Released: 02/10/2011 Document Revised: 10/28/2014 Document Reviewed: 12/29/2015 Elsevier Interactive Patient Education  2017 Elsevier Inc.  Wound Care, Adult Taking care of your wound properly can help to prevent pain and infection. It can also help your wound to heal more quickly. How is this treated? Wound care  Follow instructions from your health care provider about how to take care of your wound. Make sure you: ? Wash your hands with soap and water before you change the bandage (dressing). If soap and water are not available, use hand sanitizer. ? Change your dressing as told by your health care provider. ? Leave stitches (sutures), skin glue, or adhesive strips in place. These skin closures may need to stay  in place for 2 weeks or longer. If adhesive strip edges start to loosen and curl up, you may trim the loose edges. Do not remove adhesive strips completely unless your health care provider tells you to do that.  Check your wound area every day for signs of infection. Check for: ? More redness, swelling, or pain. ?  More fluid or blood. ? Warmth. ? Pus or a bad smell.  Ask your health care provider if you should clean the wound with mild soap and water. Doing this may include: ? Using a clean towel to pat the wound dry after cleaning it. Do not rub or scrub the wound. ? Applying a cream or ointment. Do this only as told by your health care provider. ? Covering the incision with a clean dressing.  Ask your health care provider when you can leave the wound uncovered. Medicines   If you were prescribed an antibiotic medicine, cream, or ointment, take or use the antibiotic as told by your health care provider. Do not stop taking or using the antibiotic even if your condition improves.  Take over-the-counter and prescription medicines only as told by your health care provider. If you were prescribed pain medicine, take it at least 30 minutes before doing any wound care or as told by your health care provider. General instructions  Return to your normal activities as told by your health care provider. Ask your health care provider what activities are safe.  Do not scratch or pick at the wound.  Keep all follow-up visits as told by your health care provider. This is important.  Eat a diet that includes protein, vitamin A, vitamin C, and other nutrient-rich foods. These help the wound heal: ? Protein-rich foods include meat, dairy, beans, nuts, and other sources. ? Vitamin A-rich foods include carrots and dark green, leafy vegetables. ? Vitamin C-rich foods include citrus, tomatoes, and other fruits and vegetables. ? Nutrient-rich foods have protein, carbohydrates, fat, vitamins, or minerals.  Eat a variety of healthy foods including vegetables, fruits, and whole grains. Contact a health care provider if:  You received a tetanus shot and you have swelling, severe pain, redness, or bleeding at the injection site.  Your pain is not controlled with medicine.  You have more redness, swelling, or pain around the wound.  You have more fluid or blood coming from the wound.  Your wound feels warm to the touch.  You have pus or a bad smell coming from the wound.  You have a fever or chills.  You are nauseous or you vomit.  You are dizzy. Get help right away if:  You have a red streak going away from your wound.  The edges of the wound open up and separate.  Your wound is bleeding and the bleeding does not stop with gentle pressure.  You have a rash.  You faint.  You have trouble breathing. This information is not intended to replace advice given to you by your health care provider. Make sure you discuss any questions you have with your health care provider. Document Released: 03/22/2008 Document Revised: 02/10/2016 Document Reviewed: 12/29/2015 Elsevier Interactive Patient Education  2017 Elsevier Inc.       Edwina BarthMiguel Anabel Lykins, MD [facility]

## 2017-09-29 ENCOUNTER — Ambulatory Visit: Payer: Self-pay | Admitting: Emergency Medicine

## 2017-10-02 ENCOUNTER — Ambulatory Visit (INDEPENDENT_AMBULATORY_CARE_PROVIDER_SITE_OTHER): Payer: Self-pay | Admitting: Emergency Medicine

## 2017-10-02 ENCOUNTER — Other Ambulatory Visit: Payer: Self-pay

## 2017-10-02 ENCOUNTER — Encounter: Payer: Self-pay | Admitting: Emergency Medicine

## 2017-10-02 VITALS — BP 130/78 | HR 89 | Temp 99.8°F | Resp 16 | Ht 70.0 in | Wt 175.2 lb

## 2017-10-02 DIAGNOSIS — S81812S Laceration without foreign body, left lower leg, sequela: Secondary | ICD-10-CM

## 2017-10-02 NOTE — Progress Notes (Signed)
Joselyn ArrowFernando Bermudez [place] 26 y.o.   Chief Complaint  Patient presents with  . Follow-up    Laceration of calf, left, sequela, "still kinda hurting when walking, itchy"    HISTORY OF PRESENT ILLNESS: This is a 27 y.o. male here for follow-up of laceration to the left lower leg.  Still itching and hurting a little bit but overall getting better.  Has no new complaints.  HPI   Prior to Admission medications   Medication Sig Start Date End Date Taking? Authorizing Provider  chlordiazePOXIDE (LIBRIUM) 25 MG capsule 50mg  PO TID x 1D, then 25-50mg  PO BID X 1D, then 25-50mg  PO QD X 1D Patient not taking: Reported on 09/06/2017 04/12/16   Melene PlanFloyd, Dan, DO  ondansetron (ZOFRAN ODT) 4 MG disintegrating tablet Take 1 tablet (4 mg total) by mouth every 8 (eight) hours as needed for nausea or vomiting. Patient not taking: Reported on 09/06/2017 04/12/16   Melene PlanFloyd, Dan, DO  oxyCODONE (ROXICODONE) 5 MG immediate release tablet Take 1 tablet (5 mg total) by mouth every 4 (four) hours as needed for severe pain. Patient not taking: Reported on 09/22/2017 04/12/16   Melene PlanFloyd, Dan, DO    No Known Allergies  Patient Active Problem List   Diagnosis Date Noted  . Laceration of calf 09/06/2017  . Visit for suture removal 09/06/2017    No past medical history on file.  No past surgical history on file.  Social History   Socioeconomic History  . Marital status: Single    Spouse name: Not on file  . Number of children: Not on file  . Years of education: Not on file  . Highest education level: Not on file  Occupational History  . Not on file  Social Needs  . Financial resource strain: Not on file  . Food insecurity:    Worry: Not on file    Inability: Not on file  . Transportation needs:    Medical: Not on file    Non-medical: Not on file  Tobacco Use  . Smoking status: Current Every Day Smoker  . Smokeless tobacco: Never Used  Substance and Sexual Activity  . Alcohol use: Yes  . Drug use: Yes   Types: Marijuana, Cocaine  . Sexual activity: Not on file  Lifestyle  . Physical activity:    Days per week: Not on file    Minutes per session: Not on file  . Stress: Not on file  Relationships  . Social connections:    Talks on phone: Not on file    Gets together: Not on file    Attends religious service: Not on file    Active member of club or organization: Not on file    Attends meetings of clubs or organizations: Not on file    Relationship status: Not on file  . Intimate partner violence:    Fear of current or ex partner: Not on file    Emotionally abused: Not on file    Physically abused: Not on file    Forced sexual activity: Not on file  Other Topics Concern  . Not on file  Social History Narrative  . Not on file    No family history on file.   Review of Systems  Constitutional: Negative.  Negative for chills and fever.  Respiratory: Negative for cough and shortness of breath.   Cardiovascular: Negative for chest pain and palpitations.  Gastrointestinal: Negative for diarrhea, nausea and vomiting.  Skin: Positive for itching.  Neurological: Negative for dizziness and  headaches.  Endo/Heme/Allergies: Negative.      Vitals:   10/02/17 1349  BP: 130/78  Pulse: 89  Resp: 16  Temp: 99.8 F (37.7 C)  SpO2: 98%     Physical Exam  Constitutional: He is oriented to person, place, and time. He appears well-developed and well-nourished.  HENT:  Head: Normocephalic and atraumatic.  Eyes: Pupils are equal, round, and reactive to light. EOM are normal.  Neck: Normal range of motion. Neck supple.  Cardiovascular: Normal rate and regular rhythm.  Pulmonary/Chest: Effort normal.  Musculoskeletal: Normal range of motion.  Neurological: He is alert and oriented to person, place, and time.  Skin: Skin is warm and dry.  Left lower leg: No sign of infection.  Continues to heal well.  Still has a palpable hematoma, slightly tender, with slightly erythematous borders.    Psychiatric: He has a normal mood and affect. His behavior is normal.  Vitals reviewed.    ASSESSMENT & PLAN: Binh was seen today for follow-up.  Diagnoses and all orders for this visit:  Laceration of calf, left, sequela   Recheck in 2 weeks.  Patient Instructions       IF you received an x-ray today, you will receive an invoice from [facility]. Please contact [facility] at [phone] with questions or concerns regarding your invoice.   IF you received labwork today, you will receive an invoice from [place]. Please contact [facility] at [phone] with questions or concerns regarding your invoice.   Our billing staff will not be able to assist you with questions regarding bills from these companies.  You will be contacted with the lab results as soon as they are available. The fastest way to get your results is to activate your My Chart account. Instructions are located on the last page of this paperwork. If you have not heard from [place] regarding the results in 2 weeks, please contact this office.      Cuidado de las [employer] adultos (Wound Care, Adult) El cuidado correcto de las heridas puede ayudar a Psychologist, sport and exercise y a Radio producer las infecciones. Adems puede ayudar a que la cicatrizacin sea ms rpida. CMO CUIDAR LA HERIDA Cuidado de las [place] las indicaciones del mdico acerca del cuidado de la herida. Haga lo siguiente: ? Lvese las manos con agua y jabn antes de cambiar la venda (vendaje). Use desinfectante para manos si no dispone de [place] y [place]. ? Cambie el vendaje como se lo haya indicado el mdico. ? No retire los puntos (suturas), el [employer] para la piel o las tiras [place]. Es posible que estos deban quedar puestos en la piel durante 2semanas o ms tiempo. Si los bordes de las tiras [address] empiezan a despegarse y Scientific laboratory technician, puede recortar los que estn sueltos. No retire las tiras [employer] por completo a menos  que el mdico se lo indique.  Controle la zona de la herida todos los [address]  [address] para detectar signos de infeccin. Est atento a los siguientes signos: ? Aumento del enrojecimiento, de la hinchazn o del dolor. ? Ms lquido [place]. ? Calor. ? Pus o mal olor.  Pregntele al mdico si debe limpiar la herida con agua y [place]. Hacer esto puede incluir lo siguiente: ? Usar una toalla limpia para secar la herida dando palmaditas despus de limpiarla. No se frote ni restregue la herida. ? Aplicar una crema o un ungento. Hgalo nicamente como se lo haya indicado el mdico. ? Cubrir la incisin con un  vendaje limpio.  Pregntele al mdico cundo puede dejar la herida al descubierto. Medicamentos  Si le recetaron un antibitico, una crema o un ungento con antibitico, tmelo o aplqueselo como se lo haya indicado el mdico. No deje de tomar o de usar el antibitico aunque la afeccin mejore.  Tome los medicamentos de venta libre y los recetados solamente como se lo haya indicado el mdico. Si le recetaron un analgsico, tmelo al menos antes de Education officer, environmental el cuidado de la herida, Public librarian se lo haya indicado el mdico. Instrucciones generales  Retome sus actividades normales como se lo haya indicado el mdico. Pregntele al mdico qu actividades son seguras.  No se rasque ni se toque la herida.  Concurra a todas las visitas de control como se lo haya indicado el mdico. Esto es importante.  Mantenga una dieta que incluya protenas, vitaminaA, vitaminaC y otros alimentos ricos en nutrientes. Estos alimentos ayudan a Personnel officer herida: ? Los alimentos ricos en protenas incluyen carne, productos lcteos, frijoles y nueces, entre otras fuentes de protena. ? Los alimentos ricos en vitaminaA incluyen zanahorias y verduras de hoja verde oscuro. ? Los alimentos ricos en vitaminaC incluyen ctricos, tomates y [place] frutas y verduras. ? Los alimentos ricos en nutrientes tienen  protenas, carbohidratos, grasas, vitaminas o minerales. Consuma una variedad de alimentos saludables, que incluya frutas, verduras y Radiation protection practitioner. SOLICITE ATENCIN MDICA SI:  Le aplicaron la antitetnica y tiene hinchazn, dolor intenso, enrojecimiento o hemorragia en el lugar de la inyeccin.  El dolor no se alivia con los [employer].  Tiene ms enrojecimiento, hinchazn o dolor alrededor de la herida.  Observa lquido o sangre que proviene de la herida.  La herida est caliente al tacto.  Tiene pus o percibe mal olor que emana de la herida.  Tiene fiebre o siente escalofros.  Siente nuseas o vomita.  Tiene mareos.  SOLICITE ATENCIN MDICA DE INMEDIATO SI:  Tiene una lnea roja que sale de la herida.  Los bordes de la herida se abren y se separan.  La herida est sangrando y la hemorragia no se detiene con una presin [place].  Tiene una erupcin cutnea.  Se desmaya.  Tiene dificultad para respirar.  Esta informacin no tiene Theme park manager el consejo del mdico. Asegrese de hacerle al mdico cualquier pregunta que tenga. Document Released: 02/10/2011 Document Revised: 10/28/2014 Document Reviewed: 12/29/2015 Elsevier Interactive Patient Education  2017 Elsevier Inc.     Edwina Barth, MD [facility]

## 2017-10-02 NOTE — Patient Instructions (Addendum)
   IF you received an x-ray today, you will receive an invoice from [facility]. Please contact [facility] at [phone] with questions or concerns regarding your invoice.   IF you received labwork today, you will receive an invoice from [facility]. Please contact [facility] at [phone] with questions or concerns regarding your invoice.   Our billing staff will not be able to assist you with questions regarding bills from these companies.  You will be contacted with the lab results as soon as they are available. The fastest way to get your results is to activate your My Chart account. Instructions are located on the last page of this paperwork. If you have not heard from us regarding the results in 2 weeks, please contact this office.     Cuidado de las heridas en los adultos (Wound Care, Adult) El cuidado correcto de las heridas puede ayudar a evitar el dolor y a prevenir las infecciones. Adems puede ayudar a que la cicatrizacin sea ms rpida. CMO CUIDAR LA HERIDA Cuidado de las heridas  Siga las indicaciones del mdico acerca del cuidado de la herida. Haga lo siguiente: ? Lvese las manos con agua y jabn antes de cambiar la venda (vendaje). Use desinfectante para manos si no dispone de agua y jabn. ? Cambie el vendaje como se lo haya indicado el mdico. ? No retire los puntos (suturas), el adhesivo para la piel o las tiras adhesivas. Es posible que estos deban quedar puestos en la piel durante 2semanas o ms tiempo. Si los bordes de las tiras adhesivas empiezan a despegarse y enroscarse, puede recortar los que estn sueltos. No retire las tiras adhesivas por completo a menos que el mdico se lo indique.  Controle la zona de la herida todos los das para detectar signos de infeccin. Est atento a los siguientes signos: ? Aumento del enrojecimiento, de la hinchazn o del dolor. ? Ms lquido o sangre. ? Calor. ? Pus o mal olor.  Pregntele al mdico si  debe limpiar la herida con agua y jabn suave. Hacer esto puede incluir lo siguiente: ? Usar una toalla limpia para secar la herida dando palmaditas despus de limpiarla. No se frote ni restregue la herida. ? Aplicar una crema o un ungento. Hgalo nicamente como se lo haya indicado el mdico. ? Cubrir la incisin con un vendaje limpio.  Pregntele al mdico cundo puede dejar la herida al descubierto. Medicamentos  Si le recetaron un antibitico, una crema o un ungento con antibitico, tmelo o aplqueselo como se lo haya indicado el mdico. No deje de tomar o de usar el antibitico aunque la afeccin mejore.  Tome los medicamentos de venta libre y los recetados solamente como se lo haya indicado el mdico. Si le recetaron un analgsico, tmelo al menos 30minutos antes de realizar el cuidado de la herida, como se lo haya indicado el mdico. Instrucciones generales  Retome sus actividades normales como se lo haya indicado el mdico. Pregntele al mdico qu actividades son seguras.  No se rasque ni se toque la herida.  Concurra a todas las visitas de control como se lo haya indicado el mdico. Esto es importante.  Mantenga una dieta que incluya protenas, vitaminaA, vitaminaC y otros alimentos ricos en nutrientes. Estos alimentos ayudan a cicatrizar la herida: ? Los alimentos ricos en protenas incluyen carne, productos lcteos, frijoles y nueces, entre otras fuentes de protena. ? Los alimentos ricos en vitaminaA incluyen zanahorias y verduras de hoja verde oscuro. ? Los alimentos ricos en   vitaminaC incluyen ctricos, tomates y otras frutas y verduras. ? Los alimentos ricos en nutrientes tienen protenas, carbohidratos, grasas, vitaminas o minerales. Consuma una variedad de alimentos saludables, que incluya frutas, verduras y cereales integrales. SOLICITE ATENCIN MDICA SI:  Le aplicaron la antitetnica y tiene hinchazn, dolor intenso, enrojecimiento o hemorragia en el lugar de la  inyeccin.  El dolor no se alivia con los medicamentos.  Tiene ms enrojecimiento, hinchazn o dolor alrededor de la herida.  Observa lquido o sangre que proviene de la herida.  La herida est caliente al tacto.  Tiene pus o percibe mal olor que emana de la herida.  Tiene fiebre o siente escalofros.  Siente nuseas o vomita.  Tiene mareos.  SOLICITE ATENCIN MDICA DE INMEDIATO SI:  Tiene una lnea roja que sale de la herida.  Los bordes de la herida se abren y se separan.  La herida est sangrando y la hemorragia no se detiene con una presin suave.  Tiene una erupcin cutnea.  Se desmaya.  Tiene dificultad para respirar.  Esta informacin no tiene como fin reemplazar el consejo del mdico. Asegrese de hacerle al mdico cualquier pregunta que tenga. Document Released: 02/10/2011 Document Revised: 10/28/2014 Document Reviewed: 12/29/2015 Elsevier Interactive Patient Education  2017 [employer]vier Inc.  

## 2018-12-14 ENCOUNTER — Emergency Department (HOSPITAL_COMMUNITY)
Admission: EM | Admit: 2018-12-14 | Discharge: 2018-12-14 | Disposition: A | Discharge status: Home / Self Care | Payer: Self-pay | Attending: Emergency Medicine | Admitting: Emergency Medicine

## 2018-12-14 ENCOUNTER — Emergency Department (HOSPITAL_COMMUNITY): Payer: Self-pay

## 2018-12-14 ENCOUNTER — Encounter (HOSPITAL_COMMUNITY): Payer: Self-pay | Admitting: *Deleted

## 2018-12-14 DIAGNOSIS — Y92410 Unspecified street and highway as the place of occurrence of the external cause: Secondary | ICD-10-CM | POA: Insufficient documentation

## 2018-12-14 DIAGNOSIS — Y999 Unspecified external cause status: Secondary | ICD-10-CM | POA: Insufficient documentation

## 2018-12-14 DIAGNOSIS — F17200 Nicotine dependence, unspecified, uncomplicated: Secondary | ICD-10-CM | POA: Insufficient documentation

## 2018-12-14 DIAGNOSIS — Y9355 Activity, bike riding: Secondary | ICD-10-CM | POA: Insufficient documentation

## 2018-12-14 DIAGNOSIS — S70212A Abrasion, left hip, initial encounter: Secondary | ICD-10-CM | POA: Insufficient documentation

## 2018-12-14 MED ORDER — NAPROXEN 375 MG PO TABS: 375.0000 mg | ORAL_TABLET | Freq: Two times a day (BID) | ORAL | 0 refills | Status: AC

## 2018-12-14 MED ORDER — IBUPROFEN 800 MG PO TABS
800.0000 mg | ORAL_TABLET | Freq: Once | ORAL | Status: AC
  Administered 2018-12-14: 800 mg via ORAL
  Filled 2018-12-14: qty 1
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
## 2018-12-14 NOTE — Discharge Instructions (Signed)
Solicite ayuda de inmediato si: Tiene una lnea roja que se sale desde la herida. Tiene fiebre. Observa lquido, sangre o pus que salen de la herida. Percibe que sale mal olor de la herida o del vendaje.

## 2020-01-20 ENCOUNTER — Emergency Department (HOSPITAL_COMMUNITY)
Admission: EM | Admit: 2020-01-20 | Discharge: 2020-01-21 | Disposition: A | Discharge status: Home / Self Care | Payer: Self-pay | Attending: Emergency Medicine | Admitting: Emergency Medicine

## 2020-01-20 ENCOUNTER — Other Ambulatory Visit: Payer: Self-pay

## 2020-01-20 DIAGNOSIS — Y999 Unspecified external cause status: Secondary | ICD-10-CM | POA: Insufficient documentation

## 2020-01-20 DIAGNOSIS — X58XXXA Exposure to other specified factors, initial encounter: Secondary | ICD-10-CM | POA: Insufficient documentation

## 2020-01-20 DIAGNOSIS — Y939 Activity, unspecified: Secondary | ICD-10-CM | POA: Insufficient documentation

## 2020-01-20 DIAGNOSIS — Y9289 Other specified places as the place of occurrence of the external cause: Secondary | ICD-10-CM | POA: Insufficient documentation

## 2020-01-20 DIAGNOSIS — F10920 Alcohol use, unspecified with intoxication, uncomplicated: Secondary | ICD-10-CM

## 2020-01-20 DIAGNOSIS — F10129 Alcohol abuse with intoxication, unspecified: Secondary | ICD-10-CM | POA: Insufficient documentation

## 2020-01-20 DIAGNOSIS — F17200 Nicotine dependence, unspecified, uncomplicated: Secondary | ICD-10-CM | POA: Insufficient documentation

## 2020-01-20 DIAGNOSIS — S40211A Abrasion of right shoulder, initial encounter: Secondary | ICD-10-CM | POA: Insufficient documentation

## 2020-01-20 LAB — CBC WITH DIFFERENTIAL/PLATELET
Abs Immature Granulocytes: 0.02 10*3/uL (ref 0.00–0.07)
Basophils Absolute: 0 10*3/uL (ref 0.0–0.1)
Basophils Relative: 0 %
Eosinophils Absolute: 0.2 10*3/uL (ref 0.0–0.5)
Eosinophils Relative: 3 %
HCT: 45.4 % (ref 39.0–52.0)
Hemoglobin: 15.1 g/dL (ref 13.0–17.0)
Immature Granulocytes: 0 %
Lymphocytes Relative: 26 %
Lymphs Abs: 1.8 10*3/uL (ref 0.7–4.0)
MCH: 31.1 pg (ref 26.0–34.0)
MCHC: 33.3 g/dL (ref 30.0–36.0)
MCV: 93.6 fL (ref 80.0–100.0)
Monocytes Absolute: 0.6 10*3/uL (ref 0.1–1.0)
Monocytes Relative: 10 %
Neutro Abs: 4.1 10*3/uL (ref 1.7–7.7)
Neutrophils Relative %: 61 %
Platelets: 327 10*3/uL (ref 150–400)
RBC: 4.85 MIL/uL (ref 4.22–5.81)
RDW: 12.4 % (ref 11.5–15.5)
WBC: 6.8 10*3/uL (ref 4.0–10.5)
nRBC: 0 % (ref 0.0–0.2)

## 2020-01-20 LAB — COMPREHENSIVE METABOLIC PANEL
ALT: 122 U/L — ABNORMAL HIGH (ref 0–44)
AST: 121 U/L — ABNORMAL HIGH (ref 15–41)
Albumin: 4.3 g/dL (ref 3.5–5.0)
Alkaline Phosphatase: 61 U/L (ref 38–126)
Anion gap: 14 (ref 5–15)
BUN: 9 mg/dL (ref 6–20)
CO2: 22 mmol/L (ref 22–32)
Calcium: 8.9 mg/dL (ref 8.9–10.3)
Chloride: 99 mmol/L (ref 98–111)
Creatinine, Ser: 1.06 mg/dL (ref 0.61–1.24)
GFR calc Af Amer: >60 mL/min (ref >60)
GFR calc non Af Amer: >60 mL/min (ref >60)
Glucose, Bld: 111 mg/dL — ABNORMAL HIGH (ref 70–99)
Potassium: 4 mmol/L (ref 3.5–5.1)
Sodium: 135 mmol/L (ref 135–145)
Total Bilirubin: 1.7 mg/dL — ABNORMAL HIGH (ref 0.3–1.2)
Total Protein: 7.7 g/dL (ref 6.5–8.1)

## 2020-01-20 LAB — RAPID URINE DRUG SCREEN, HOSP PERFORMED
Amphetamines: POSITIVE — AB
Barbiturates: NOT DETECTED
Benzodiazepines: NOT DETECTED
Cocaine: NOT DETECTED
Opiates: NOT DETECTED
Tetrahydrocannabinol: NOT DETECTED

## 2020-01-20 LAB — ETHANOL: Alcohol, Ethyl (B): 338 mg/dL (ref <10)

## 2020-01-20 MED ORDER — LACTATED RINGERS IV BOLUS
1000.0000 mL | Freq: Once | INTRAVENOUS | Status: AC
  Administered 2020-01-20: 1000 mL via INTRAVENOUS

## 2020-01-20 NOTE — ED Notes (Signed)
Ambulated pt around room, pt noted to have slighty unsteady gait.
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
## 2020-01-21 NOTE — ED Notes (Signed)
Discharge instructions reviewed with pt. Pt verbalized understanding.  Spanish interpretor used. 
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
## 2021-05-11 ENCOUNTER — Emergency Department (HOSPITAL_COMMUNITY)
Admission: EM | Admit: 2021-05-11 | Discharge: 2021-05-12 | Disposition: A | Discharge status: Home / Self Care | Payer: Self-pay | Attending: Emergency Medicine | Admitting: Emergency Medicine

## 2021-05-11 ENCOUNTER — Encounter (HOSPITAL_COMMUNITY): Payer: Self-pay

## 2021-05-11 ENCOUNTER — Emergency Department (HOSPITAL_COMMUNITY): Payer: Self-pay

## 2021-05-11 DIAGNOSIS — S1083XA Contusion of other specified part of neck, initial encounter: Secondary | ICD-10-CM | POA: Insufficient documentation

## 2021-05-11 DIAGNOSIS — F17200 Nicotine dependence, unspecified, uncomplicated: Secondary | ICD-10-CM | POA: Insufficient documentation

## 2021-05-11 DIAGNOSIS — S5012XA Contusion of left forearm, initial encounter: Secondary | ICD-10-CM | POA: Insufficient documentation

## 2021-05-11 DIAGNOSIS — R52 Pain, unspecified: Secondary | ICD-10-CM

## 2021-05-11 DIAGNOSIS — Z79899 Other long term (current) drug therapy: Secondary | ICD-10-CM | POA: Insufficient documentation

## 2021-05-11 DIAGNOSIS — S40021A Contusion of right upper arm, initial encounter: Secondary | ICD-10-CM | POA: Insufficient documentation

## 2021-05-11 DIAGNOSIS — S40022A Contusion of left upper arm, initial encounter: Secondary | ICD-10-CM | POA: Insufficient documentation

## 2021-05-11 DIAGNOSIS — R519 Headache, unspecified: Secondary | ICD-10-CM | POA: Insufficient documentation

## 2021-05-11 DIAGNOSIS — S80812A Abrasion, left lower leg, initial encounter: Secondary | ICD-10-CM | POA: Insufficient documentation

## 2021-05-11 DIAGNOSIS — S80811A Abrasion, right lower leg, initial encounter: Secondary | ICD-10-CM | POA: Insufficient documentation

## 2021-05-11 DIAGNOSIS — Z23 Encounter for immunization: Secondary | ICD-10-CM | POA: Insufficient documentation

## 2021-05-11 DIAGNOSIS — T07XXXA Unspecified multiple injuries, initial encounter: Secondary | ICD-10-CM

## 2021-05-11 DIAGNOSIS — S5011XA Contusion of right forearm, initial encounter: Secondary | ICD-10-CM | POA: Insufficient documentation

## 2021-05-11 DIAGNOSIS — R109 Unspecified abdominal pain: Secondary | ICD-10-CM | POA: Insufficient documentation

## 2021-05-11 DIAGNOSIS — Y900 Blood alcohol level of less than 20 mg/100 ml: Secondary | ICD-10-CM | POA: Insufficient documentation

## 2021-05-11 LAB — CBC
HCT: 46.7 % (ref 39.0–52.0)
Hemoglobin: 15.9 g/dL (ref 13.0–17.0)
MCH: 30 pg (ref 26.0–34.0)
MCHC: 34 g/dL (ref 30.0–36.0)
MCV: 88.1 fL (ref 80.0–100.0)
Platelets: 286 10*3/uL (ref 150–400)
RBC: 5.3 MIL/uL (ref 4.22–5.81)
RDW: 12.9 % (ref 11.5–15.5)
WBC: 6.7 10*3/uL (ref 4.0–10.5)
nRBC: 0 % (ref 0.0–0.2)

## 2021-05-11 LAB — BASIC METABOLIC PANEL
Anion gap: 11 (ref 5–15)
BUN: 17 mg/dL (ref 6–20)
CO2: 26 mmol/L (ref 22–32)
Calcium: 9.5 mg/dL (ref 8.9–10.3)
Chloride: 98 mmol/L (ref 98–111)
Creatinine, Ser: 1.1 mg/dL (ref 0.61–1.24)
GFR, Estimated: >60 mL/min (ref >60)
Glucose, Bld: 138 mg/dL — ABNORMAL HIGH (ref 70–99)
Potassium: 3.4 mmol/L — ABNORMAL LOW (ref 3.5–5.1)
Sodium: 135 mmol/L (ref 135–145)

## 2021-05-11 LAB — ETHANOL: Alcohol, Ethyl (B): <10 mg/dL (ref <10)

## 2021-05-11 MED ORDER — IOHEXOL 350 MG/ML SOLN
80.0000 mL | Freq: Once | INTRAVENOUS | Status: AC | PRN
  Administered 2021-05-11: 80 mL via INTRAVENOUS

## 2021-05-11 MED ORDER — IBUPROFEN 200 MG PO TABS
600.0000 mg | ORAL_TABLET | Freq: Once | ORAL | Status: AC
  Administered 2021-05-11: 600 mg via ORAL
  Filled 2021-05-11: qty 3

## 2021-05-11 MED ORDER — TETANUS-DIPHTH-ACELL PERTUSSIS 5-2.5-18.5 LF-MCG/0.5 IM SUSY
0.5000 mL | PREFILLED_SYRINGE | Freq: Once | INTRAMUSCULAR | Status: AC
  Administered 2021-05-11: 0.5 mL via INTRAMUSCULAR
  Filled 2021-05-11: qty 0.5
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
## 2021-05-11 NOTE — ED Notes (Signed)
Pt now c/o left femur pain and inability to move due to that pain. EDP aware

## 2021-05-11 NOTE — ED Notes (Signed)
Dressings applied to bilateral knees. Wound education given with interpreter.

## 2021-05-11 NOTE — Discharge Instructions (Signed)
The x-rays did not show any signs of fracture.  Take over-the-counter medications as needed for pain

## 2021-05-12 ENCOUNTER — Emergency Department (HOSPITAL_COMMUNITY): Payer: Self-pay

## 2021-05-12 NOTE — ED Notes (Signed)
Pt ambulated to waiting room with steady gait and without difficulty

## 2021-05-21 ENCOUNTER — Other Ambulatory Visit: Payer: Self-pay

## 2021-05-21 ENCOUNTER — Encounter (HOSPITAL_COMMUNITY): Payer: Self-pay | Admitting: Emergency Medicine

## 2021-05-21 ENCOUNTER — Emergency Department (HOSPITAL_COMMUNITY)
Admission: EM | Admit: 2021-05-21 | Discharge: 2021-05-22 | Disposition: A | Discharge status: Home / Self Care | Payer: Self-pay | Attending: Emergency Medicine | Admitting: Emergency Medicine

## 2021-05-21 DIAGNOSIS — Y908 Blood alcohol level of 240 mg/100 ml or more: Secondary | ICD-10-CM | POA: Insufficient documentation

## 2021-05-21 DIAGNOSIS — F10129 Alcohol abuse with intoxication, unspecified: Secondary | ICD-10-CM | POA: Insufficient documentation

## 2021-05-21 DIAGNOSIS — M25562 Pain in left knee: Secondary | ICD-10-CM | POA: Insufficient documentation

## 2021-05-21 DIAGNOSIS — R519 Headache, unspecified: Secondary | ICD-10-CM | POA: Insufficient documentation

## 2021-05-21 DIAGNOSIS — Z79899 Other long term (current) drug therapy: Secondary | ICD-10-CM | POA: Insufficient documentation

## 2021-05-21 DIAGNOSIS — F17200 Nicotine dependence, unspecified, uncomplicated: Secondary | ICD-10-CM | POA: Insufficient documentation

## 2021-05-21 DIAGNOSIS — F10920 Alcohol use, unspecified with intoxication, uncomplicated: Secondary | ICD-10-CM

## 2021-05-21 LAB — CBC
HCT: 45.7 % (ref 39.0–52.0)
Hemoglobin: 15.2 g/dL (ref 13.0–17.0)
MCH: 30.5 pg (ref 26.0–34.0)
MCHC: 33.3 g/dL (ref 30.0–36.0)
MCV: 91.8 fL (ref 80.0–100.0)
Platelets: 342 10*3/uL (ref 150–400)
RBC: 4.98 MIL/uL (ref 4.22–5.81)
RDW: 13.6 % (ref 11.5–15.5)
WBC: 10.6 10*3/uL — ABNORMAL HIGH (ref 4.0–10.5)
nRBC: 0 % (ref 0.0–0.2)

## 2021-05-21 LAB — RAPID URINE DRUG SCREEN, HOSP PERFORMED
Amphetamines: NOT DETECTED
Barbiturates: NOT DETECTED
Benzodiazepines: NOT DETECTED
Cocaine: NOT DETECTED
Opiates: NOT DETECTED
Tetrahydrocannabinol: NOT DETECTED

## 2021-05-21 LAB — COMPREHENSIVE METABOLIC PANEL
ALT: 20 U/L (ref 0–44)
AST: 24 U/L (ref 15–41)
Albumin: 4.6 g/dL (ref 3.5–5.0)
Alkaline Phosphatase: 47 U/L (ref 38–126)
Anion gap: 11 (ref 5–15)
BUN: 16 mg/dL (ref 6–20)
CO2: 27 mmol/L (ref 22–32)
Calcium: 9.2 mg/dL (ref 8.9–10.3)
Chloride: 102 mmol/L (ref 98–111)
Creatinine, Ser: 0.96 mg/dL (ref 0.61–1.24)
GFR, Estimated: >60 mL/min (ref >60)
Glucose, Bld: 137 mg/dL — ABNORMAL HIGH (ref 70–99)
Potassium: 3.6 mmol/L (ref 3.5–5.1)
Sodium: 140 mmol/L (ref 135–145)
Total Bilirubin: 0.6 mg/dL (ref 0.3–1.2)
Total Protein: 8 g/dL (ref 6.5–8.1)

## 2021-05-21 LAB — ETHANOL: Alcohol, Ethyl (B): 313 mg/dL (ref <10)
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
## 2021-05-22 ENCOUNTER — Emergency Department (HOSPITAL_COMMUNITY): Payer: Self-pay
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
## 2021-05-22 NOTE — ED Notes (Signed)
Patient able to ambulate without assistance.

## 2021-10-12 ENCOUNTER — Emergency Department (HOSPITAL_COMMUNITY)
Admission: EM | Admit: 2021-10-12 | Discharge: 2021-10-13 | Disposition: A | Discharge status: Home / Self Care | Payer: Self-pay | Attending: Emergency Medicine | Admitting: Emergency Medicine

## 2021-10-12 ENCOUNTER — Emergency Department (HOSPITAL_COMMUNITY): Payer: Self-pay

## 2021-10-12 ENCOUNTER — Encounter (HOSPITAL_COMMUNITY): Payer: Self-pay | Admitting: *Deleted

## 2021-10-12 ENCOUNTER — Other Ambulatory Visit: Payer: Self-pay

## 2021-10-12 DIAGNOSIS — Z79899 Other long term (current) drug therapy: Secondary | ICD-10-CM | POA: Insufficient documentation

## 2021-10-12 DIAGNOSIS — Y92511 Restaurant or cafe as the place of occurrence of the external cause: Secondary | ICD-10-CM | POA: Insufficient documentation

## 2021-10-12 DIAGNOSIS — S0181XA Laceration without foreign body of other part of head, initial encounter: Secondary | ICD-10-CM | POA: Insufficient documentation

## 2021-10-12 DIAGNOSIS — K002 Abnormalities of size and form of teeth: Secondary | ICD-10-CM | POA: Insufficient documentation

## 2021-10-12 DIAGNOSIS — M25551 Pain in right hip: Secondary | ICD-10-CM | POA: Insufficient documentation

## 2021-10-12 LAB — CBC WITH DIFFERENTIAL/PLATELET
Abs Immature Granulocytes: 0.02 10*3/uL (ref 0.00–0.07)
Basophils Absolute: 0 10*3/uL (ref 0.0–0.1)
Basophils Relative: 0 %
Eosinophils Absolute: 0.2 10*3/uL (ref 0.0–0.5)
Eosinophils Relative: 4 %
HCT: 41.5 % (ref 39.0–52.0)
Hemoglobin: 14.2 g/dL (ref 13.0–17.0)
Immature Granulocytes: 0 %
Lymphocytes Relative: 45 %
Lymphs Abs: 2.4 10*3/uL (ref 0.7–4.0)
MCH: 30.8 pg (ref 26.0–34.0)
MCHC: 34.2 g/dL (ref 30.0–36.0)
MCV: 90 fL (ref 80.0–100.0)
Monocytes Absolute: 0.4 10*3/uL (ref 0.1–1.0)
Monocytes Relative: 7 %
Neutro Abs: 2.4 10*3/uL (ref 1.7–7.7)
Neutrophils Relative %: 44 %
Platelets: 267 10*3/uL (ref 150–400)
RBC: 4.61 MIL/uL (ref 4.22–5.81)
RDW: 13.5 % (ref 11.5–15.5)
WBC: 5.5 10*3/uL (ref 4.0–10.5)
nRBC: 0 % (ref 0.0–0.2)

## 2021-10-12 LAB — CK: Total CK: 66 U/L (ref 49–397)

## 2021-10-12 LAB — COMPREHENSIVE METABOLIC PANEL
ALT: 13 U/L (ref 0–44)
AST: 16 U/L (ref 15–41)
Albumin: 3.7 g/dL (ref 3.5–5.0)
Alkaline Phosphatase: 46 U/L (ref 38–126)
Anion gap: 13 (ref 5–15)
BUN: 10 mg/dL (ref 6–20)
CO2: 23 mmol/L (ref 22–32)
Calcium: 8.6 mg/dL — ABNORMAL LOW (ref 8.9–10.3)
Chloride: 108 mmol/L (ref 98–111)
Creatinine, Ser: 1.1 mg/dL (ref 0.61–1.24)
GFR, Estimated: >60 mL/min (ref >60)
Glucose, Bld: 112 mg/dL — ABNORMAL HIGH (ref 70–99)
Potassium: 3.5 mmol/L (ref 3.5–5.1)
Sodium: 144 mmol/L (ref 135–145)
Total Bilirubin: 0.5 mg/dL (ref 0.3–1.2)
Total Protein: 6.3 g/dL — ABNORMAL LOW (ref 6.5–8.1)

## 2021-10-12 LAB — ETHANOL: Alcohol, Ethyl (B): 169 mg/dL — ABNORMAL HIGH (ref <10)

## 2021-10-12 MED ORDER — SODIUM CHLORIDE 0.9 % IV BOLUS
1000.0000 mL | Freq: Once | INTRAVENOUS | Status: AC
  Administered 2021-10-12: 1000 mL via INTRAVENOUS
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
## 2021-10-12 NOTE — ED Notes (Signed)
Patient aware that we need urine sample for testing, unable at this time. Pt given instruction on providing urine sample when able to do so.   

## 2021-10-13 ENCOUNTER — Emergency Department (HOSPITAL_COMMUNITY): Payer: Self-pay

## 2021-10-13 LAB — RAPID URINE DRUG SCREEN, HOSP PERFORMED
Amphetamines: POSITIVE — AB
Barbiturates: NOT DETECTED
Benzodiazepines: NOT DETECTED
Cocaine: NOT DETECTED
Opiates: NOT DETECTED
Tetrahydrocannabinol: NOT DETECTED

## 2021-10-13 LAB — LACTIC ACID, PLASMA: Lactic Acid, Venous: 1.7 mmol/L (ref 0.5–1.9)

## 2021-10-13 MED ORDER — LIDOCAINE-EPINEPHRINE (PF) 2 %-1:200000 IJ SOLN
INTRAMUSCULAR | Status: AC
  Administered 2021-10-13: 20 mL
  Filled 2021-10-13: qty 20

## 2021-10-13 NOTE — ED Notes (Signed)
Patient transported to CT 
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
## 2021-10-13 NOTE — ED Notes (Signed)
Pt up, dressed himself, ambulating in room. Pt has been given meal bag and drink ?

## 2022-06-03 ENCOUNTER — Emergency Department (HOSPITAL_COMMUNITY)
Admission: EM | Admit: 2022-06-03 | Discharge: 2022-06-04 | Disposition: A | Discharge status: Home / Self Care | Payer: Self-pay | Attending: Emergency Medicine | Admitting: Emergency Medicine

## 2022-06-03 DIAGNOSIS — S0990XA Unspecified injury of head, initial encounter: Secondary | ICD-10-CM | POA: Insufficient documentation

## 2022-06-03 DIAGNOSIS — F10129 Alcohol abuse with intoxication, unspecified: Secondary | ICD-10-CM | POA: Insufficient documentation

## 2022-06-03 DIAGNOSIS — Y907 Blood alcohol level of 200-239 mg/100 ml: Secondary | ICD-10-CM | POA: Insufficient documentation

## 2022-06-03 DIAGNOSIS — F10920 Alcohol use, unspecified with intoxication, uncomplicated: Secondary | ICD-10-CM
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
## 2022-06-04 ENCOUNTER — Other Ambulatory Visit: Payer: Self-pay

## 2022-06-04 ENCOUNTER — Emergency Department (HOSPITAL_COMMUNITY): Payer: Self-pay

## 2022-06-04 ENCOUNTER — Encounter (HOSPITAL_COMMUNITY): Payer: Self-pay

## 2022-06-04 LAB — CBC WITH DIFFERENTIAL/PLATELET
Abs Immature Granulocytes: 0.04 10*3/uL (ref 0.00–0.07)
Basophils Absolute: 0 10*3/uL (ref 0.0–0.1)
Basophils Relative: 0 %
Eosinophils Absolute: 0.1 10*3/uL (ref 0.0–0.5)
Eosinophils Relative: 2 %
HCT: 41.9 % (ref 39.0–52.0)
Hemoglobin: 14.1 g/dL (ref 13.0–17.0)
Immature Granulocytes: 1 %
Lymphocytes Relative: 20 %
Lymphs Abs: 1.4 10*3/uL (ref 0.7–4.0)
MCH: 30.8 pg (ref 26.0–34.0)
MCHC: 33.7 g/dL (ref 30.0–36.0)
MCV: 91.5 fL (ref 80.0–100.0)
Monocytes Absolute: 0.5 10*3/uL (ref 0.1–1.0)
Monocytes Relative: 8 %
Neutro Abs: 4.9 10*3/uL (ref 1.7–7.7)
Neutrophils Relative %: 69 %
Platelets: 245 10*3/uL (ref 150–400)
RBC: 4.58 MIL/uL (ref 4.22–5.81)
RDW: 13.3 % (ref 11.5–15.5)
WBC: 6.9 10*3/uL (ref 4.0–10.5)
nRBC: 0 % (ref 0.0–0.2)

## 2022-06-04 LAB — BASIC METABOLIC PANEL
Anion gap: 15 (ref 5–15)
BUN: 11 mg/dL (ref 6–20)
CO2: 17 mmol/L — ABNORMAL LOW (ref 22–32)
Calcium: 8.8 mg/dL — ABNORMAL LOW (ref 8.9–10.3)
Chloride: 109 mmol/L (ref 98–111)
Creatinine, Ser: 0.61 mg/dL (ref 0.61–1.24)
GFR, Estimated: >60 mL/min (ref >60)
Glucose, Bld: 123 mg/dL — ABNORMAL HIGH (ref 70–99)
Potassium: 3.2 mmol/L — ABNORMAL LOW (ref 3.5–5.1)
Sodium: 141 mmol/L (ref 135–145)

## 2022-06-04 LAB — ETHANOL: Alcohol, Ethyl (B): 230 mg/dL — ABNORMAL HIGH (ref <10)

## 2022-06-04 MED ORDER — ACETAMINOPHEN 500 MG PO TABS
1000.0000 mg | ORAL_TABLET | Freq: Once | ORAL | Status: AC
Start: 2022-06-04 — End: 2022-06-04
  Administered 2022-06-04: 1000 mg via ORAL
  Filled 2022-06-04: qty 2
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
